# Patient Record
Sex: Male | Born: 1950 | Race: Black or African American | Hispanic: No | Marital: Married | Smoking: Never smoker
Health system: Southern US, Community
[De-identification: ages and names within clinical notes are randomized; demographics above are authoritative.]

## PROBLEM LIST (undated history)

## (undated) DIAGNOSIS — H409 Unspecified glaucoma: Secondary | ICD-10-CM

## (undated) HISTORY — PX: TENDON REPAIR: SHX5111

## (undated) HISTORY — DX: Unspecified glaucoma: H40.9

---

## 2013-06-16 ENCOUNTER — Encounter: Payer: Managed Care, Other (non HMO) | Attending: Family Medicine | Admitting: Dietician

## 2013-06-16 ENCOUNTER — Encounter: Payer: Self-pay | Admitting: Dietician

## 2013-06-16 VITALS — Ht 69.5 in | Wt 172.9 lb

## 2013-06-16 DIAGNOSIS — R7309 Other abnormal glucose: Secondary | ICD-10-CM | POA: Insufficient documentation

## 2013-06-16 DIAGNOSIS — Z713 Dietary counseling and surveillance: Secondary | ICD-10-CM | POA: Insufficient documentation

## 2013-06-16 NOTE — Progress Notes (Signed)
Appt start time: 1700 end time:  1800.  Assessment:  Patient was seen on 06/13/13 for individual diabetes education. Pt reports having made a number of changes and lost about 5 lbs since his visit with MD where he learned of pre-diabetes. No other medical complications noted outside glaucoma.  Current HbA1c: 6.4  Preferred Learning Style:   No preference indicated   Learning Readiness:   Change in progress  MEDICATIONS: see list.  DIETARY INTAKE: Usual eating pattern includes 3 meals and 0-2 snacks per day. Everyday foods include almonds, fruit, veg, chix.  Avoided foods include most processed foods.    24-hr recall:  B ( AM): half grapefruit with nuts and berries. Juice of spinach, kale, romaine, cucumbers, lemon. Oatmeal with berries is also reasonably common. Sometimes dry cereal like corn flakes with almonds (unsweetened) and almond milk. Green tea with stevia recently in favor of coffee with hazelnut creamer. Fridays will go to [employer] to get bacon, egg, and cheese sandwich on english muffin with hazelnut latte.    Snk ( AM): almonds  L ( PM): mainly salads with vinaigrette dressing and grilled chix. Other options include [employer] roast beef with cabbage and spinach.   Snk ( PM): almonds D ( PM): steamed veg such as carrots, greens, broccoli, 3-5 oz. Piece of meat or fish or chix.  Snk ( PM): none Beverages: water, coffee, green tea, grapefruit juice, vegetable juices, red wine Fri and Sat night about 1-2 glasses. Rarely other EtOH.  Pt states he has recently cut out a number of processed foods since recent pre-diabetic HgA1c reading, such as fast food and microwave breakfast sandwiches. He has also basically eliminated sweets entirely.  Usual physical activity: makes effort to do more walking at work by parking farther away, taking stairs, doing some walking on breaks. 2-3 days per week some calisthenics. Recently rejoined [facility]. Enjoys walking, running, cycling. Optimistic about  increasing exercise at [facility] about 2-3 days per week.   Progress Towards Goal(s):  In progress.   Nutritional Diagnosis:  NB-1.1 Food and nutrition-related knowledge deficit As related to eating pattern for pre-diabetes/ carb control.  As evidenced by pt reports of same, lack of prior formal education.    Intervention:  Nutrition counseling provided.  Discussed diabetes disease process and treatment options.  Discussed physiology of diabetes and role of obesity on insulin resistance.  Encouraged moderate weight reduction to improve glucose levels.  Discussed role of medications and diet in glucose control  Provided education on macronutrients on glucose levels.  Provided education on carb counting, importance of regularly scheduled meals/snacks, and meal planning  Discussed effects of physical activity on glucose levels and long-term glucose control.  Recommended 150 minutes of physical activity/week.  Reviewed patient medications.  Discussed role of medication on blood glucose and possible side effects  Discussed blood glucose monitoring and interpretation.  Discussed recommended target ranges and individual ranges.    Described short-term complications: hyper- and hypo-glycemia.  Discussed causes,symptoms, and treatment options.  Discussed prevention, detection, and treatment of long-term complications.  Discussed the role of prolonged elevated glucose levels on body systems.  Discussed role of stress on blood glucose levels and discussed strategies to manage psychosocial issues.  Discussed recommendations for long-term diabetes self-care.  Established checklist for medical, dental, and emotional self-care.  Teaching Method Utilized:  Visual Auditory  Handouts given during visit include:  Living Well with Diabetes  Best CHO, Pro, and Fat rich foods  Home Workout  Barriers to learning/adherence  to lifestyle change: minimal.  Diabetes self-care support plan:   [facility] support  group  Pt's wife is here today as well to learn more about appropriate alterations to diet.  Pt and wife have already made numerous changes for the positive. RD interventions included kcal control to 1800-2100 per day to continue gradual weight loss, increase total exercise to one hour per day.  Demonstrated degree of understanding via:  Teach Back   Monitoring/Evaluation:  Dietary intake, exercise, portion control, and body weight in 2 month(s).

## 2013-07-18 ENCOUNTER — Ambulatory Visit: Payer: Self-pay | Admitting: *Deleted

## 2013-08-18 ENCOUNTER — Encounter: Payer: Managed Care, Other (non HMO) | Attending: Family Medicine | Admitting: Dietician

## 2013-08-18 ENCOUNTER — Encounter: Payer: Self-pay | Admitting: Dietician

## 2013-08-18 VITALS — Ht 69.5 in | Wt 168.1 lb

## 2013-08-18 DIAGNOSIS — Z713 Dietary counseling and surveillance: Secondary | ICD-10-CM | POA: Insufficient documentation

## 2013-08-18 DIAGNOSIS — R7309 Other abnormal glucose: Secondary | ICD-10-CM | POA: Insufficient documentation

## 2013-08-18 NOTE — Progress Notes (Signed)
Assessment: Pt reports for F/U on pre-DM with previous weight ~173 lbs and HgA1c 6.4. Now, his weight is 168 lbs with HgA1c 6.1. He has taken to eating much more nonstarchy vegetable and lean protein, although he is concerned with possibly too low protein intake with breakfast (often only cereal and almond milk). He has some recent GI distress with loose stool and a Hx of IBS. His exercise is now roughly 2 hours each weekend with some walking during the week, and more daily lifestyle activity, like using stairs and parking the care farther from office.     Intervention:  RD advised pt that loose stool is most likely related to shift toward much higher fiber diet. He can balance this with higher dairy intake, mainly Kefir, Greek yogurt, and lactose-free milk. For breakfast, RD highlighted easy options, and suggested a protein shake as a meal replacement for very fast high protein breakfast. Approved supplement list provided. RD advised pt to increase walking or other exercise frequency and duration to 30 minutes each day during work week to maximize results.   Demonstrated degree of understanding via:  Teach Back   Monitoring/Evaluation:  Dietary intake, exercise, GI symptoms, HgA1c, and body weight in 6 month(s).

## 2014-02-17 ENCOUNTER — Ambulatory Visit: Payer: Managed Care, Other (non HMO) | Admitting: Dietician

## 2014-02-21 ENCOUNTER — Ambulatory Visit: Payer: Managed Care, Other (non HMO) | Admitting: *Deleted

## 2014-02-21 ENCOUNTER — Encounter: Payer: Managed Care, Other (non HMO) | Attending: Family Medicine | Admitting: *Deleted

## 2014-02-21 DIAGNOSIS — Z713 Dietary counseling and surveillance: Secondary | ICD-10-CM | POA: Diagnosis not present

## 2014-02-21 DIAGNOSIS — R7309 Other abnormal glucose: Secondary | ICD-10-CM | POA: Diagnosis not present

## 2014-02-21 DIAGNOSIS — R7303 Prediabetes: Secondary | ICD-10-CM

## 2014-02-21 NOTE — Progress Notes (Signed)
Appointment start time 1415  Appointment end time 1500  Assessment: Gregory Ferguson is here for follow up nutrition pertaining to diabetes education. He has worked with [employer], RD in the past. Reports most recent A1c is 6.1%, which is maintained since the spring.  He reports continued improved dietary habits: limiting sugars and choosing low GI foods.  He hasn't exercised as much. He had been maintaining his weight around 160 lb.  B: flatbread sandwich with egg whites and bacon.  6 oz OJ Trop 50; sometimes cheerios or another low sugar cereal with Fairlife milk L: salad from [employer] with lite [place] dressing, or grilled chicken sandwich; or [employer] with chicken or fish with vegetables S: almonds or crackers and peanut butter  D: salmon or another type of fish with vegetable (steamed). Seldom has red meat and typically foods are baked or broiled S: never dessert.  Sometimes has protein bar or apple  Misses starchy foods and gravy   Intervention:  Corrected beliefs about carbohydrates and encouraged Jamond to add carbs back into his life in moderation.  Discussed carb counting and reading food labels.  Recommended daily physical activity and suggested walking with wife after dinner.  Discussed role of BGM and agreed it was a good idea to give Joshue more knowledge.  Meal plan: 3-4 carb choices/meal and 1-2 carb choices/snack  Handouts given: Meal plan card Snack sheet   Demonstrated degree of understanding via:  Teach Back   Monitoring/Evaluation:  Dietary intake, exercise, HgA1c, and body weight in 3 month(s).

## 2014-05-25 ENCOUNTER — Ambulatory Visit: Payer: Managed Care, Other (non HMO) | Admitting: *Deleted

## 2014-05-30 ENCOUNTER — Encounter: Payer: Managed Care, Other (non HMO) | Attending: Family Medicine | Admitting: *Deleted

## 2014-05-30 DIAGNOSIS — Z713 Dietary counseling and surveillance: Secondary | ICD-10-CM | POA: Insufficient documentation

## 2014-05-30 DIAGNOSIS — R7309 Other abnormal glucose: Secondary | ICD-10-CM | POA: Diagnosis not present

## 2014-05-30 NOTE — Patient Instructions (Signed)
Fat slows down the digestion and absorption of carbohydrate.   Foods that are high in both fat and carbs like pizza, [place], [place] foods can cause your glucose to stay high longer.   Aim for exericse on lunch break (30 minutes) then when it's light outside again, exercise in the evenings

## 2014-05-30 NOTE — Progress Notes (Signed)
Appointment start time: 0930  Appointment end time 1000  Assessment: Gregory Ferguson is here for follow up nutrition pertaining to diabetes education. Reports most recent A1c is 6.2%, which is up slightly from 6.1%.  He has incorporated more carbohydrates in his diet, which is good because he was too restrictive before.  However, he is not as physically active as he was before.  He tries to park as far away as possible and take the staris whenever possible, but he does not engage in purposeful exercise  Dietary recall: B: french toast sticks (4) with 2 sausage patties, and 6 oz OJ L: Asian salad S: not usually.  Might have protein bar or Nabs  D: 2 slices multigrain bread with pork BBQ and cole slaw and glass water S: peanuts   Intervention: Nutrition counseling provided.  Answered patient questions about role of fat on glucose levels and recommendations for fat.  Reiterated importance of regular physical activity on glucose levels, as well as preventing dementia, which is a concern of his.  Recommended walking on lunch break until it's daylight savings time again and he can go in the evenings  Meal plan: 3-4 carb choices/meal and 1-2 carb choices/snack   Demonstrated degree of understanding via:  Teach Back   Monitoring/Evaluation:  Dietary intake, exercise, HgA1c, and body weight in 3 month(s).

## 2015-01-10 ENCOUNTER — Other Ambulatory Visit: Payer: Self-pay | Admitting: Family Medicine

## 2015-01-10 DIAGNOSIS — M5416 Radiculopathy, lumbar region: Secondary | ICD-10-CM

## 2015-01-20 ENCOUNTER — Ambulatory Visit
Admission: RE | Admit: 2015-01-20 | Discharge: 2015-01-20 | Disposition: A | Discharge status: Home / Self Care | Payer: Managed Care, Other (non HMO) | Source: Ambulatory Visit | Attending: Family Medicine | Admitting: Family Medicine

## 2015-01-20 DIAGNOSIS — M5416 Radiculopathy, lumbar region: Secondary | ICD-10-CM

## 2015-02-09 ENCOUNTER — Encounter: Payer: Self-pay | Admitting: Physical Therapy

## 2015-02-09 ENCOUNTER — Ambulatory Visit: Payer: Managed Care, Other (non HMO) | Attending: Family Medicine | Admitting: Physical Therapy

## 2015-02-09 DIAGNOSIS — R29898 Other symptoms and signs involving the musculoskeletal system: Secondary | ICD-10-CM | POA: Diagnosis present

## 2015-02-09 DIAGNOSIS — M24651 Ankylosis, right hip: Secondary | ICD-10-CM | POA: Diagnosis present

## 2015-02-09 NOTE — Patient Instructions (Signed)
ABDUCTION: Side-Lying (Active)   Lie on left side, top leg straight. Raise top leg as far as possible. Use ___ lbs. Complete ___ sets of ___ repetitions. Perform ___ sessions per day.  http://gtsc.exer.us/94   Copyright  VHI. All rights reserved.  Abduction: Clam (Eccentric) - Side-Lying   Lie on side with knees bent. Lift top knee, keeping feet together. Keep trunk steady. Slowly lower for 3-5 seconds. ___ reps per set, ___ sets per day, ___ days per week. Add ___ lbs when you achieve ___ repetitions.  Copyright  VHI. All rights reserved.  Hamstring Stretch   With other leg bent, foot flat, grasp right leg and slowly try to straighten knee. Hold ____ seconds. Repeat ____ times. Do ____ sessions per day.  http://gt2.exer.us/279   Copyright  VHI. All rights reserved.  Extensors / Rotators, Supine   Lie supine, one leg straight, other leg bent, knee held by opposite hand. Gently pull knee toward opposite shoulder. Feel stretch in buttocks and outside of hip. Hold ___ seconds. Repeat ___ times per session. Do ___ sessions per day.  Copyright  VHI. All rights reserved.

## 2015-02-09 NOTE — Therapy (Signed)
[facility] [address], [place], [place], [address] Phone: [phone]   Fax:  [phone]  Physical Therapy Evaluation  Patient Details  Name: Gregory Ferguson MRN: [id number] Date of Birth: 29-Oct-1950 Referring Provider:  Lujean Amel, MD  Encounter Date: 02/09/2015      PT End of Session - 02/09/15 0832    Visit Number 1   Date for PT Re-Evaluation 03/09/15   PT Start Time 0759   PT Stop Time 0833   PT Time Calculation (min) 34 min   Activity Tolerance Patient tolerated treatment well   Behavior During Therapy [facility] for tasks assessed/performed      Past Medical History  Diagnosis Date  . Glaucoma     Past Surgical History  Procedure Laterality Date  . Tendon repair Right     also left    There were no vitals filed for this visit.  Visit Diagnosis:  Weakness of right hip - Plan: PT plan of care cert/re-cert  Decreased range of motion of hip, right - Plan: PT plan of care cert/re-cert      Subjective Assessment - 02/09/15 0805    Pertinent History history of 2 x achilles tendon rupture and repair 20-30 years ago            [facility] PT Assessment - 02/09/15 0001    Assessment   Medical Diagnosis Rt hip pain, low back pain   Onset Date/Surgical Date 08/09/14   Next MD Visit none scheduled   Precautions   Precautions None   Restrictions   Weight Bearing Restrictions No   Balance Screen   Has the patient fallen in the past 6 months No   [place] residence   Prior Function   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   Observation/Other Assessments   Focus on Therapeutic Outcomes (FOTO)  25%   Sensation   Light Touch Appears Intact   ROM / Strength   AROM / PROM / Strength AROM;Strength   AROM   Overall AROM  --  lumbar ROM WFL   Right/Left Hip Right   Right Hip Extension --  WFL   Right Hip Flexion --  90, limited by HS  length   Right Hip External Rotation  --  WFL   Right Hip Internal Rotation  --  [facility]   Strength   Strength Assessment Site Hip   Right/Left Hip Right   Right Hip Flexion 4/5   Right Hip Extension 4/5   Right Hip ABduction 4-/5   Flexibility   Soft Tissue Assessment /Muscle Length --  decreased HS length R>L   Palpation   SI assessment  --  no tenderness   Palpation comment --  TTP Rt piriformis   Special Tests    Special Tests Lumbar   Lumbar Tests FABER test;Straight Leg Raise   FABER test   findings Negative   Straight Leg Raise   Findings Negative                           PT Education - 02/09/15 0828    Education provided Yes   Education Details HEP   Person(s) Educated Patient   Methods Explanation;Demonstration;Handout   Comprehension Returned demonstration;Verbalized understanding          PT Short Term Goals - 02/09/15 0835    PT SHORT TERM GOAL #1   Title  Pt will be I with basic HEP   Time 2   Period Weeks   PT SHORT TERM GOAL #2   Title Pt will improve Rt hip strength to 4+/5 to tolerate activity with decreased symptoms   Time 2   Period Weeks           PT Long Term Goals - 02/09/15 0836    PT LONG TERM GOAL #1   Title Pt will be I wiht advanced HEP   Time 4   Period Weeks   PT LONG TERM GOAL #2   Title Pt will improve FOTO to 20% to demo improved funcitonal mobility   Time 4   Period Weeks   PT LONG TERM GOAL #3   Title Pt will perform walking 30 minutes wihtout increase in symptoms   Time 4   Period Weeks               Plan - 02/09/15 1749    Clinical Impression Statement Pt presents with pain in Rt hip radiating to Rt foot, intermittent pain.  Pt with decreased mm lenght in hips and hamstrings, mm weakness in hip abductors.  Pt will benefit from skilled PT to address deficits and increase functional mobility without symptoms.   Pt will benefit from skilled therapeutic intervention in order to improve on  the following deficits Decreased strength;Difficulty walking;Pain;Impaired flexibility;Decreased activity tolerance   Rehab Potential Good   PT Frequency 1x / week   PT Duration 4 weeks   PT Treatment/Interventions Cryotherapy;Electrical Stimulation;Moist Heat;Patient/family education;Neuromuscular re-education;Therapeutic exercise;Therapeutic activities;Functional mobility training;Balance training;Manual techniques;Passive range of motion   PT Next Visit Plan assess HEP, progress strength, stretching, manual as indicated   PT Home Exercise Plan Rt hip stretching and strengthening   Consulted and Agree with Plan of Care Patient         Problem List Patient Active Problem List   Diagnosis Date Noted  . Other abnormal glucose 06/16/2013    Isabelle Course, PT, DPT  02/09/2015, 8:42 AM  [facility] [address] [place], [place], [address] Phone: [phone]   Fax:  [phone]

## 2015-02-15 ENCOUNTER — Ambulatory Visit: Payer: Managed Care, Other (non HMO) | Admitting: Physical Therapy

## 2015-02-15 ENCOUNTER — Encounter: Payer: Self-pay | Admitting: Physical Therapy

## 2015-02-15 DIAGNOSIS — R29898 Other symptoms and signs involving the musculoskeletal system: Secondary | ICD-10-CM

## 2015-02-15 DIAGNOSIS — M24651 Ankylosis, right hip: Secondary | ICD-10-CM

## 2015-02-15 NOTE — Patient Instructions (Signed)
Piriformis (Supine)  Cross legs, right on top. Gently pull other knee toward chest until stretch is felt in buttock/hip of top leg. Hold ____ seconds. Repeat  3 times per set. Do  3  sets per session. Do  1 -2  sessions per day.  Hip Stretch  Put right ankle over left knee. Let right knee fall downward, but keep ankle in place. Feel the stretch in hip. May push down gently with hand to feel stretch. Hold ____ seconds while counting out loud. Repeat with other leg. Repeat  3  times. Do  3  sessions per day.  Stretching: Piriformis   Cross right leg over other thigh and place elbow over outside of knee. Gently stretch buttock muscles by pushing bent knee across body. Hold  20 seconds. Repeat  3  times per set. Do  2-3  sets per session. Do 3 sessions per day.  Stretching: Piriformis (Supine)  Pull right knee toward opposite shoulder. Hold _20___ seconds. Relax. Repeat _3__ times per set. Do __2-3__ sets per session. Do ____ sessions per day.  Piriformis Stretch, Kneeling Modified [place]  From hands and knees, slide one leg backward, turn bent other leg out slightly to side. Rest weight on outside of bent leg. If extended hip is elevated place a blanket or towel underneath to relax hip. With back straight, lay trunk forward over bent leg. Hold  20  seconds.  Repeat 3  times per session. Do  2-3  sessions per day.  Copyright  VHI. All rights reserved.

## 2015-02-15 NOTE — Therapy (Addendum)
[facility] [address] [place], [place], [address] Phone: [phone]   Fax:  [phone]  Physical Therapy Treatment  Patient Details  Name: Gregory Ferguson MRN: [id number] Date of Birth: 1951-04-28 Referring Provider:  Lujean Amel, MD  Encounter Date: 02/15/2015      PT End of Session - 02/15/15 1206    Visit Number 2   Date for PT Re-Evaluation 03/09/15   PT Start Time 1146   PT Stop Time 1229   PT Time Calculation (min) 43 min   Activity Tolerance Patient tolerated treatment well   Behavior During Therapy [facility] for tasks assessed/performed      Past Medical History  Diagnosis Date  . Glaucoma     Past Surgical History  Procedure Laterality Date  . Tendon repair Right     also left    There were no vitals filed for this visit.  Visit Diagnosis:  Decreased range of motion of hip, right  Weakness of right hip      Subjective Assessment - 02/15/15 1154    Subjective Pt reports compliance with initial HEP. Rt hip is aching and feeling of tired feeling - intermittend.     Pertinent History history of 2 x achilles tendon rupture and repair 20-30 years ago   Patient Stated Goals decrease aching/pain   Currently in Pain? Yes   Pain Score 4   4/10   Pain Location Hip   Pain Orientation Right   Pain Descriptors / Indicators Aching   Pain Type Chronic pain   Pain Onset More than a month ago   Pain Frequency Intermittent                         [facility] Adult PT Treatment/Exercise - 02/15/15 0001    Exercises   Exercises Knee/Hip;Lumbar   Lumbar Exercises: Stretches   Single Knee to Chest Stretch 3 reps;20 seconds  each leg   Piriformis Stretch 3 reps;20 seconds  in supine using towel, halfsitting and sitting at end of tab   Lumbar Exercises: Supine   Bridge 10 reps;5 seconds  needs vc's to keep pelvic leveled   Knee/Hip Exercises: Stretches   Active Hamstring Stretch 3 reps;20  seconds  on stairs   Knee/Hip Exercises: Aerobic   Stationary Bike L 1 63mn    Knee/Hip Exercises: Prone   Hip Extension Strengthening;1 set;10 reps  with qluteal squezzes, challenging due to weakness                PT Education - 02/15/15 1313    Education provided Yes   Education Details Figure 4 stretch in supint halfsitting, sitting and prone   Person(s) Educated Patient   Methods Explanation;Demonstration;Handout   Comprehension Returned demonstration          PT Short Term Goals - 02/15/15 1317    PT SHORT TERM GOAL #1   Title Pt will be I with basic HEP   Time 2   Period Weeks   Status On-going   PT SHORT TERM GOAL #2   Title Pt will improve Rt hip strength to 4+/5 to tolerate activity with decreased symptoms   Time 2   Period Weeks   Status On-going           PT Long Term Goals - 02/15/15 1317    PT LONG TERM GOAL #1   Title Pt will be I wiht advanced HEP   Time 4  Period Weeks   Status On-going   PT LONG TERM GOAL #2   Title Pt will improve FOTO to 20% to demo improved funcitonal mobility   Time 4   Period Weeks   Status On-going   PT LONG TERM GOAL #3   Title Pt will perform walking 30 minutes wihtout increase in symptoms   Time 4   Period Weeks   Status On-going               Plan - 02/15/15 1313    Clinical Impression Statement Pt with discomfort in Rt hip radiating to Rt foot, intermittend, Pt with tight hamstrings and weak hip abductors. Pt will benefit from skilled PT to address deficits and manage discomfort.   Pt will benefit from skilled therapeutic intervention in order to improve on the following deficits Decreased strength;Difficulty walking;Pain;Impaired flexibility;Decreased activity tolerance   Rehab Potential Good   PT Frequency 1x / week   PT Duration 4 weeks   PT Treatment/Interventions Cryotherapy;Electrical Stimulation;Moist Heat;Patient/family education;Neuromuscular re-education;Therapeutic  exercise;Therapeutic activities;Functional mobility training;Balance training;Manual techniques;Passive range of motion   PT Next Visit Plan Review figure 4 stretch in all variations -practice prone piriformis stretch (eye of the needle)   PT Home Exercise Plan Rt hip stretching and strengthening   Consulted and Agree with Plan of Care Patient        Problem List Patient Active Problem List   Diagnosis Date Noted  . Other abnormal glucose 06/16/2013    NAUMANN-HOUEGNIFIO,ELKE 02/15/2015, 1:19 PM PHYSICAL THERAPY DISCHARGE SUMMARY  Visits from Start of Care: 2  Current functional level related to goals / functional outcomes: Pt attended 2 PT visits and requested D/C due to work schedule.     Remaining deficits: See above for most current status.     Education / Equipment: HEP Plan: Patient agrees to discharge.  Patient goals were partially met. Patient is being discharged due to the patient's request.  ?????   Sigurd Sos, PT 03/08/2015 4:14 PM  [facility] [address] [place], [place], [address] Phone: [phone]   Fax:  [phone]

## 2015-02-28 ENCOUNTER — Ambulatory Visit: Payer: Managed Care, Other (non HMO) | Attending: Family Medicine | Admitting: Physical Therapy

## 2015-03-08 ENCOUNTER — Encounter: Payer: Managed Care, Other (non HMO) | Admitting: Physical Therapy

## 2016-10-31 DIAGNOSIS — Z23 Encounter for immunization: Secondary | ICD-10-CM | POA: Diagnosis not present

## 2016-10-31 DIAGNOSIS — R7303 Prediabetes: Secondary | ICD-10-CM | POA: Diagnosis not present

## 2016-10-31 DIAGNOSIS — Z13220 Encounter for screening for lipoid disorders: Secondary | ICD-10-CM | POA: Diagnosis not present

## 2016-10-31 DIAGNOSIS — Z0000 Encounter for general adult medical examination without abnormal findings: Secondary | ICD-10-CM | POA: Diagnosis not present

## 2016-12-19 DIAGNOSIS — Z1211 Encounter for screening for malignant neoplasm of colon: Secondary | ICD-10-CM | POA: Diagnosis not present

## 2017-02-06 DIAGNOSIS — H401123 Primary open-angle glaucoma, left eye, severe stage: Secondary | ICD-10-CM | POA: Diagnosis not present

## 2017-03-05 DIAGNOSIS — Z23 Encounter for immunization: Secondary | ICD-10-CM | POA: Diagnosis not present

## 2017-05-05 DIAGNOSIS — R7303 Prediabetes: Secondary | ICD-10-CM | POA: Diagnosis not present

## 2017-06-03 DIAGNOSIS — H547 Unspecified visual loss: Secondary | ICD-10-CM | POA: Diagnosis not present

## 2017-06-03 DIAGNOSIS — H524 Presbyopia: Secondary | ICD-10-CM | POA: Diagnosis not present

## 2017-06-03 DIAGNOSIS — H52223 Regular astigmatism, bilateral: Secondary | ICD-10-CM | POA: Diagnosis not present

## 2017-06-03 DIAGNOSIS — H401122 Primary open-angle glaucoma, left eye, moderate stage: Secondary | ICD-10-CM | POA: Diagnosis not present

## 2017-06-03 DIAGNOSIS — H5203 Hypermetropia, bilateral: Secondary | ICD-10-CM | POA: Diagnosis not present

## 2017-06-15 DIAGNOSIS — H903 Sensorineural hearing loss, bilateral: Secondary | ICD-10-CM | POA: Diagnosis not present

## 2017-06-15 DIAGNOSIS — H9113 Presbycusis, bilateral: Secondary | ICD-10-CM | POA: Diagnosis not present

## 2017-06-29 DIAGNOSIS — H401111 Primary open-angle glaucoma, right eye, mild stage: Secondary | ICD-10-CM | POA: Diagnosis not present

## 2017-06-29 DIAGNOSIS — H401123 Primary open-angle glaucoma, left eye, severe stage: Secondary | ICD-10-CM | POA: Diagnosis not present

## 2017-06-29 DIAGNOSIS — H35372 Puckering of macula, left eye: Secondary | ICD-10-CM | POA: Diagnosis not present

## 2017-07-09 DIAGNOSIS — H401121 Primary open-angle glaucoma, left eye, mild stage: Secondary | ICD-10-CM | POA: Diagnosis not present

## 2017-07-09 DIAGNOSIS — H5051 Esophoria: Secondary | ICD-10-CM | POA: Diagnosis not present

## 2017-07-09 DIAGNOSIS — H52223 Regular astigmatism, bilateral: Secondary | ICD-10-CM | POA: Diagnosis not present

## 2017-07-09 DIAGNOSIS — H5203 Hypermetropia, bilateral: Secondary | ICD-10-CM | POA: Diagnosis not present

## 2017-09-09 DIAGNOSIS — H401123 Primary open-angle glaucoma, left eye, severe stage: Secondary | ICD-10-CM | POA: Diagnosis not present

## 2017-10-07 DIAGNOSIS — H401123 Primary open-angle glaucoma, left eye, severe stage: Secondary | ICD-10-CM | POA: Diagnosis not present

## 2017-10-19 DIAGNOSIS — H401123 Primary open-angle glaucoma, left eye, severe stage: Secondary | ICD-10-CM | POA: Diagnosis not present

## 2017-11-11 DIAGNOSIS — R7303 Prediabetes: Secondary | ICD-10-CM | POA: Diagnosis not present

## 2017-11-11 DIAGNOSIS — Z0001 Encounter for general adult medical examination with abnormal findings: Secondary | ICD-10-CM | POA: Diagnosis not present

## 2017-11-11 DIAGNOSIS — J069 Acute upper respiratory infection, unspecified: Secondary | ICD-10-CM | POA: Diagnosis not present

## 2017-11-11 DIAGNOSIS — Z79899 Other long term (current) drug therapy: Secondary | ICD-10-CM | POA: Diagnosis not present

## 2017-11-11 DIAGNOSIS — Z125 Encounter for screening for malignant neoplasm of prostate: Secondary | ICD-10-CM | POA: Diagnosis not present

## 2017-11-11 DIAGNOSIS — Z23 Encounter for immunization: Secondary | ICD-10-CM | POA: Diagnosis not present

## 2017-11-12 DIAGNOSIS — Z0001 Encounter for general adult medical examination with abnormal findings: Secondary | ICD-10-CM | POA: Diagnosis not present

## 2017-12-07 DIAGNOSIS — H52203 Unspecified astigmatism, bilateral: Secondary | ICD-10-CM | POA: Diagnosis not present

## 2017-12-07 DIAGNOSIS — H524 Presbyopia: Secondary | ICD-10-CM | POA: Diagnosis not present

## 2017-12-07 DIAGNOSIS — H5203 Hypermetropia, bilateral: Secondary | ICD-10-CM | POA: Diagnosis not present

## 2018-02-01 DIAGNOSIS — H401123 Primary open-angle glaucoma, left eye, severe stage: Secondary | ICD-10-CM | POA: Diagnosis not present

## 2018-03-12 DIAGNOSIS — Z23 Encounter for immunization: Secondary | ICD-10-CM | POA: Diagnosis not present

## 2018-03-12 DIAGNOSIS — E7800 Pure hypercholesterolemia, unspecified: Secondary | ICD-10-CM | POA: Diagnosis not present

## 2018-03-19 DIAGNOSIS — M67912 Unspecified disorder of synovium and tendon, left shoulder: Secondary | ICD-10-CM | POA: Diagnosis not present

## 2018-04-29 DIAGNOSIS — R7303 Prediabetes: Secondary | ICD-10-CM | POA: Diagnosis not present

## 2018-05-14 DIAGNOSIS — R7303 Prediabetes: Secondary | ICD-10-CM | POA: Diagnosis not present

## 2018-07-05 DIAGNOSIS — H401123 Primary open-angle glaucoma, left eye, severe stage: Secondary | ICD-10-CM | POA: Diagnosis not present

## 2018-07-19 DIAGNOSIS — R1032 Left lower quadrant pain: Secondary | ICD-10-CM | POA: Diagnosis not present

## 2018-07-19 DIAGNOSIS — M545 Low back pain: Secondary | ICD-10-CM | POA: Diagnosis not present

## 2018-07-19 DIAGNOSIS — R1031 Right lower quadrant pain: Secondary | ICD-10-CM | POA: Diagnosis not present

## 2018-08-04 DIAGNOSIS — M545 Low back pain: Secondary | ICD-10-CM | POA: Diagnosis not present

## 2018-08-04 DIAGNOSIS — R1032 Left lower quadrant pain: Secondary | ICD-10-CM | POA: Diagnosis not present

## 2018-08-23 DIAGNOSIS — R05 Cough: Secondary | ICD-10-CM | POA: Diagnosis not present

## 2018-08-23 DIAGNOSIS — R490 Dysphonia: Secondary | ICD-10-CM | POA: Diagnosis not present

## 2018-10-15 ENCOUNTER — Ambulatory Visit
Admission: RE | Admit: 2018-10-15 | Discharge: 2018-10-15 | Disposition: A | Discharge status: Home / Self Care | Payer: PPO | Source: Ambulatory Visit | Attending: Family Medicine | Admitting: Family Medicine

## 2018-10-15 ENCOUNTER — Other Ambulatory Visit: Payer: Self-pay | Admitting: Family Medicine

## 2018-10-15 ENCOUNTER — Other Ambulatory Visit: Payer: Self-pay

## 2018-10-15 DIAGNOSIS — R059 Cough, unspecified: Secondary | ICD-10-CM

## 2018-10-15 DIAGNOSIS — R634 Abnormal weight loss: Secondary | ICD-10-CM | POA: Diagnosis not present

## 2018-10-15 DIAGNOSIS — R05 Cough: Secondary | ICD-10-CM | POA: Diagnosis not present

## 2018-11-12 DIAGNOSIS — H401123 Primary open-angle glaucoma, left eye, severe stage: Secondary | ICD-10-CM | POA: Diagnosis not present

## 2018-11-26 DIAGNOSIS — Z79899 Other long term (current) drug therapy: Secondary | ICD-10-CM | POA: Diagnosis not present

## 2018-11-26 DIAGNOSIS — R7303 Prediabetes: Secondary | ICD-10-CM | POA: Diagnosis not present

## 2018-11-26 DIAGNOSIS — Z0001 Encounter for general adult medical examination with abnormal findings: Secondary | ICD-10-CM | POA: Diagnosis not present

## 2018-11-26 DIAGNOSIS — J45909 Unspecified asthma, uncomplicated: Secondary | ICD-10-CM | POA: Diagnosis not present

## 2018-11-26 DIAGNOSIS — Z23 Encounter for immunization: Secondary | ICD-10-CM | POA: Diagnosis not present

## 2018-11-26 DIAGNOSIS — Z1211 Encounter for screening for malignant neoplasm of colon: Secondary | ICD-10-CM | POA: Diagnosis not present

## 2018-11-26 DIAGNOSIS — Z125 Encounter for screening for malignant neoplasm of prostate: Secondary | ICD-10-CM | POA: Diagnosis not present

## 2018-11-26 DIAGNOSIS — E7800 Pure hypercholesterolemia, unspecified: Secondary | ICD-10-CM | POA: Diagnosis not present

## 2018-12-09 DIAGNOSIS — H401123 Primary open-angle glaucoma, left eye, severe stage: Secondary | ICD-10-CM | POA: Diagnosis not present

## 2019-02-09 DIAGNOSIS — R030 Elevated blood-pressure reading, without diagnosis of hypertension: Secondary | ICD-10-CM | POA: Diagnosis not present

## 2019-02-09 DIAGNOSIS — Z1159 Encounter for screening for other viral diseases: Secondary | ICD-10-CM | POA: Diagnosis not present

## 2019-02-11 DIAGNOSIS — R05 Cough: Secondary | ICD-10-CM | POA: Diagnosis not present

## 2019-02-11 DIAGNOSIS — Z23 Encounter for immunization: Secondary | ICD-10-CM | POA: Diagnosis not present

## 2019-02-11 DIAGNOSIS — R079 Chest pain, unspecified: Secondary | ICD-10-CM | POA: Diagnosis not present

## 2019-02-18 ENCOUNTER — Other Ambulatory Visit: Payer: Self-pay | Admitting: Family Medicine

## 2019-02-18 DIAGNOSIS — R053 Chronic cough: Secondary | ICD-10-CM

## 2019-02-18 DIAGNOSIS — R05 Cough: Secondary | ICD-10-CM

## 2019-02-21 ENCOUNTER — Other Ambulatory Visit: Payer: Self-pay

## 2019-02-21 ENCOUNTER — Ambulatory Visit
Admission: RE | Admit: 2019-02-21 | Discharge: 2019-02-21 | Disposition: A | Discharge status: Home / Self Care | Payer: PPO | Source: Ambulatory Visit | Attending: Family Medicine | Admitting: Family Medicine

## 2019-02-21 DIAGNOSIS — I700 Atherosclerosis of aorta: Secondary | ICD-10-CM | POA: Diagnosis not present

## 2019-02-21 DIAGNOSIS — R05 Cough: Secondary | ICD-10-CM | POA: Diagnosis not present

## 2019-02-21 DIAGNOSIS — R053 Chronic cough: Secondary | ICD-10-CM

## 2019-02-21 DIAGNOSIS — Z8709 Personal history of other diseases of the respiratory system: Secondary | ICD-10-CM | POA: Diagnosis not present

## 2019-03-09 DIAGNOSIS — R05 Cough: Secondary | ICD-10-CM | POA: Diagnosis not present

## 2019-03-09 DIAGNOSIS — R0789 Other chest pain: Secondary | ICD-10-CM | POA: Diagnosis not present

## 2019-03-09 DIAGNOSIS — I10 Essential (primary) hypertension: Secondary | ICD-10-CM | POA: Diagnosis not present

## 2019-03-21 DIAGNOSIS — Z1159 Encounter for screening for other viral diseases: Secondary | ICD-10-CM | POA: Diagnosis not present

## 2019-03-23 ENCOUNTER — Institutional Professional Consult (permissible substitution): Payer: PPO | Admitting: Pulmonary Disease

## 2019-03-30 ENCOUNTER — Other Ambulatory Visit: Payer: Self-pay

## 2019-03-30 ENCOUNTER — Encounter: Payer: Self-pay | Admitting: Pulmonary Disease

## 2019-03-30 ENCOUNTER — Ambulatory Visit: Payer: PPO | Admitting: Pulmonary Disease

## 2019-03-30 VITALS — BP 128/86 | HR 81 | Temp 97.9°F | Ht 70.0 in | Wt 163.8 lb

## 2019-03-30 DIAGNOSIS — R0789 Other chest pain: Secondary | ICD-10-CM | POA: Diagnosis not present

## 2019-03-30 DIAGNOSIS — R059 Cough, unspecified: Secondary | ICD-10-CM

## 2019-03-30 DIAGNOSIS — R0602 Shortness of breath: Secondary | ICD-10-CM

## 2019-03-30 DIAGNOSIS — R05 Cough: Secondary | ICD-10-CM | POA: Diagnosis not present

## 2019-03-30 NOTE — Progress Notes (Signed)
Synopsis: Referred in November 2020 for cough by Lujean Amel, MD  Subjective:   PATIENT ID: Gregory Ferguson GENDER: male DOB: 09-21-1950, MRN: [id number]  Chief Complaint  Patient presents with  . Consult    Consult for Chest discomfort. Reports his breathing feels a bit restrictive. Dry cough. CT 01/2019.    68 yo M, c/o resp symptoms starting in march 2020. He PMH of asthma as an early teenager. He felt like these symptoms were similar to symptoms he experienced then. He feels like yearly in the spring time he normally has some mild chest congestions. He works in the year a lot and feels like allergies does bother him. He routinely wears a mask. He started zrytec in the spring. He was seen in spring time with a cxr and it was clear. He continue to have problems. He was start on budesonide inhaler and he did not see much change. He was given albuterol inhaler. He was having the issues. And there was no difference in his symptoms with albuterol.  He has had several tests to include chest imaging, EKG as well as a CT scan of the chest in October 2020.  There was no evidence of parenchymal disease on CT imaging to explain cough or shortness of breath.   Past Medical History:  Diagnosis Date  . Glaucoma      Family History  Problem Relation Age of Onset  . Hypertension Mother   . Cancer Father   . Hypertension Father   . Diabetes Maternal Uncle   . Diabetes Paternal Uncle      Past Surgical History:  Procedure Laterality Date  . TENDON REPAIR Right    also left    Social History   Socioeconomic History  . Marital status: Married    Spouse name: Not on file  . Number of children: Not on file  . Years of education: Not on file  . Highest education level: Not on file  Occupational History  . Not on file  Social Needs  . Financial resource strain: Not on file  . Food insecurity    Worry: Not on file    Inability: Not on file  . Transportation needs    Medical: Not on file     Non-medical: Not on file  Tobacco Use  . Smoking status: Never Smoker  . Smokeless tobacco: Never Used  Substance and Sexual Activity  . Alcohol use: Not on file  . Drug use: Not on file  . Sexual activity: Not on file  Lifestyle  . Physical activity    Days per week: Not on file    Minutes per session: Not on file  . Stress: Not on file  Relationships  . Social Herbalist on phone: Not on file    Gets together: Not on file    Attends religious service: Not on file    Active member of club or organization: Not on file    Attends meetings of clubs or organizations: Not on file    Relationship status: Not on file  . Intimate partner violence    Fear of current or ex partner: Not on file    Emotionally abused: Not on file    Physically abused: Not on file    Forced sexual activity: Not on file  Other Topics Concern  . Not on file  Social History Narrative  . Not on file     No Known Allergies   Outpatient Medications  Prior to Visit  Medication Sig Dispense Refill  . amLODipine (NORVASC) 2.5 MG tablet Take 2.5 mg by mouth daily.    . brimonidine (ALPHAGAN) 0.2 % ophthalmic solution Place 1 drop into the left eye 2 times daily.    . budesonide-formoterol (SYMBICORT) 80-4.5 MCG/ACT inhaler     . cetirizine (ZYRTEC) 10 MG tablet Take 10 mg by mouth daily.    . dorzolamide-timolol (COSOPT) 22.3-6.8 MG/ML ophthalmic solution Place 1 drop into the left eye every 12 hours.    . lactobacillus acidophilus (BACID) TABS tablet Take 1 tablet by mouth daily.    Marland Kitchen omeprazole (PRILOSEC) 40 MG capsule Take 40 mg by mouth every morning.    Marland Kitchen PROAIR HFA 108 (90 Base) MCG/ACT inhaler TAKE 2 PUFFS AS NEEDED EVERY 4 HRS IF NEEDED FOR WHEEZING INHALATION 30 DAYS     No facility-administered medications prior to visit.     Review of Systems  Constitutional: Negative for chills, fever, malaise/fatigue and weight loss.  HENT: Negative for hearing loss, sore throat and tinnitus.    Eyes: Negative for blurred vision and double vision.  Respiratory: Positive for cough and shortness of breath. Negative for hemoptysis, sputum production, wheezing and stridor.   Cardiovascular: Negative for chest pain, palpitations, orthopnea, leg swelling and PND.  Gastrointestinal: Negative for abdominal pain, constipation, diarrhea, heartburn, nausea and vomiting.  Genitourinary: Negative for dysuria, hematuria and urgency.  Musculoskeletal: Negative for joint pain and myalgias.  Skin: Negative for itching and rash.  Neurological: Negative for dizziness, tingling, weakness and headaches.  Endo/Heme/Allergies: Negative for environmental allergies. Does not bruise/bleed easily.  Psychiatric/Behavioral: Negative for depression. The patient is not nervous/anxious and does not have insomnia.   All other systems reviewed and are negative.    Objective:  Physical Exam Vitals signs reviewed.  Constitutional:      General: He is not in acute distress.    Appearance: He is well-developed.  HENT:     Head: Normocephalic and atraumatic.  Eyes:     General: No scleral icterus.    Conjunctiva/sclera: Conjunctivae normal.     Pupils: Pupils are equal, round, and reactive to light.  Neck:     Musculoskeletal: Neck supple.     Vascular: No JVD.     Trachea: No tracheal deviation.  Cardiovascular:     Rate and Rhythm: Normal rate and regular rhythm.     Heart sounds: Normal heart sounds. No murmur.  Pulmonary:     Effort: Pulmonary effort is normal. No tachypnea, accessory muscle usage or respiratory distress.     Breath sounds: Normal breath sounds. No stridor. No wheezing, rhonchi or rales.  Abdominal:     General: Bowel sounds are normal. There is no distension.     Palpations: Abdomen is soft.     Tenderness: There is no abdominal tenderness.  Musculoskeletal:        General: No tenderness.  Lymphadenopathy:     Cervical: No cervical adenopathy.  Skin:    General: Skin is warm and  dry.     Capillary Refill: Capillary refill takes less than 2 seconds.     Findings: No rash.  Neurological:     Mental Status: He is alert and oriented to person, place, and time.  Psychiatric:        Behavior: Behavior normal.      Vitals:   03/30/19 1101  BP: 128/86  Pulse: 81  Temp: 97.9 F (36.6 C)  TempSrc: Temporal  SpO2: 99%  Weight:  163 lb 12.8 oz (74.3 kg)  Height: 5\' 10"  (1.778 m)   99% on RA BMI Readings from Last 3 Encounters:  03/30/19 23.50 kg/m  08/18/13 24.47 kg/m  06/16/13 25.17 kg/m   Wt Readings from Last 3 Encounters:  03/30/19 163 lb 12.8 oz (74.3 kg)  08/18/13 168 lb 1.6 oz (76.2 kg)  06/16/13 172 lb 14.4 oz (78.4 kg)    CBC No results found for: WBC, RBC, HGB, HCT, PLT, MCV, MCH, MCHC, RDW, LYMPHSABS, MONOABS, EOSABS, BASOSABS  Chest Imaging:  February 21, 2019: CT chest: No acute process to explain cough per radiology.  I have reviewed the images myself.  Lung parenchyma looks normal. The patient's images have been independently reviewed by me.    Pulmonary Functions Testing Results: No flowsheet data found.  FeNO: None   Pathology: None   Echocardiogram: None   Heart Catheterization: None     Assessment & Plan:     ICD-10-CM   1. Cough  R05 FULL - Pulmonary Function Test (LBPU)  2. SOB (shortness of breath)  R06.02 FULL - Pulmonary Function Test (LBPU)  3. Chest discomfort  R07.89 FULL - Pulmonary Function Test (LBPU)    Discussion:  This is a 68 year old gentleman with recurrent symptoms of chest discomfort with an occasional cough and shortness of breath/restrictive sensation within the chest.  He has had several diagnostics completed over the past several months.  This started back in March 2020.  He does have a history of asthma as a child however his symptoms at this point seeming to be very episodic usually associated in the morning time.  He does not have any symptoms associated with exercise or riding his bike or  working outside.  Plan: I think we should start with the least invasive work-up to include pulmonary function test. His other tests have all been reassuring. If there is abnormalities within the pulmonary function test I think we could should consider additional work-up.  We could include a methacholine challenge test following normal PFTs but we will discuss this with the patient later. If he has any additional symptoms to include things like chest pain we may need to consider cardiac evaluation.  This was discussed with the patient today in the office but he believes that since the symptoms have been relatively stable since then we should just start with pulmonary function test.  And I agree. Patient was instructed to keep [place] informed about changes in any of his symptoms. He should also make sure to control any reflux symptoms that he has. If bronchial type symptoms recur I believe the next best step would to be restart the inhaled steroid that he had tried in the past and use it for at least 4 to 6 weeks daily to see if there is any improvement or change in symptoms.  Patient is also going to get back into his regular exercise and bicycle riding routine as he has not noticed any significant change in his respiratory symptoms with writing.  Patient to return to the office and see me following his pulmonary function test.  Whenever the next available appointment.  Greater than 50% of this patient's 60-minute of visit was been face-to-face discussing above recommendations and treatment plan.  In addition we reviewed the patient's CT imaging today in the office.    Current Outpatient Medications:  .  amLODipine (NORVASC) 2.5 MG tablet, Take 2.5 mg by mouth daily., Disp: , Rfl:  .  brimonidine (ALPHAGAN) 0.2 % ophthalmic  solution, Place 1 drop into the left eye 2 times daily., Disp: , Rfl:  .  budesonide-formoterol (SYMBICORT) 80-4.5 MCG/ACT inhaler, , Disp: , Rfl:  .  cetirizine (ZYRTEC) 10 MG  tablet, Take 10 mg by mouth daily., Disp: , Rfl:  .  dorzolamide-timolol (COSOPT) 22.3-6.8 MG/ML ophthalmic solution, Place 1 drop into the left eye every 12 hours., Disp: , Rfl:  .  lactobacillus acidophilus (BACID) TABS tablet, Take 1 tablet by mouth daily., Disp: , Rfl:  .  omeprazole (PRILOSEC) 40 MG capsule, Take 40 mg by mouth every morning., Disp: , Rfl:  .  PROAIR HFA 108 (90 Base) MCG/ACT inhaler, TAKE 2 PUFFS AS NEEDED EVERY 4 HRS IF NEEDED FOR WHEEZING INHALATION 30 DAYS, Disp: , Rfl:    Garner Nash, DO [facility] 03/30/2019 11:15 AM

## 2019-03-30 NOTE — Patient Instructions (Addendum)
Thank you for visiting Dr. Valeta Harms at [facility]. Today we recommend the following:  Orders Placed This Encounter  Procedures  . FULL - Pulmonary Function Test (LBPU)   Keep [place] informed about symptoms.   Return in about 8 weeks (around 05/25/2019).    Please do your part to reduce the spread of COVID-19.

## 2019-05-06 DIAGNOSIS — R7303 Prediabetes: Secondary | ICD-10-CM | POA: Diagnosis not present

## 2019-05-11 DIAGNOSIS — H401123 Primary open-angle glaucoma, left eye, severe stage: Secondary | ICD-10-CM | POA: Diagnosis not present

## 2019-05-24 ENCOUNTER — Other Ambulatory Visit (HOSPITAL_COMMUNITY): Payer: PPO

## 2019-06-10 ENCOUNTER — Ambulatory Visit: Payer: PPO | Attending: Internal Medicine

## 2019-06-10 DIAGNOSIS — Z23 Encounter for immunization: Secondary | ICD-10-CM

## 2019-06-10 NOTE — Progress Notes (Signed)
   Covid-19 Vaccination Clinic  Name:  ALEC KAROW    MRN: [id number] DOB: 04-Jun-1950  06/10/2019  Mr. Fischetti was observed post Covid-19 immunization for 15 minutes without incidence. He was provided with Vaccine Information Sheet and instruction to access the V-Safe system.   Mr. Dorin was instructed to call 911 with any severe reactions post vaccine: Marland Kitchen Difficulty breathing  . Swelling of your face and throat  . A fast heartbeat  . A bad rash all over your body  . Dizziness and weakness    Immunizations Administered    Name Date Dose VIS Date Route   Pfizer COVID-19 Vaccine 06/10/2019  4:41 PM 0.3 mL 04/29/2019 Intramuscular   Manufacturer: [place]   Lot: [id number]   [place]: [id number]

## 2019-07-01 ENCOUNTER — Ambulatory Visit: Payer: PPO | Attending: Internal Medicine

## 2019-07-01 DIAGNOSIS — Z23 Encounter for immunization: Secondary | ICD-10-CM | POA: Insufficient documentation

## 2019-07-01 NOTE — Progress Notes (Signed)
   Covid-19 Vaccination Clinic  Name:  Gregory Ferguson    MRN: [id number] DOB: 1950-05-31  07/01/2019  Mr. Nevel was observed post Covid-19 immunization for 15 minutes without incidence. He was provided with Vaccine Information Sheet and instruction to access the V-Safe system.   Mr. Hinch was instructed to call 911 with any severe reactions post vaccine: Marland Kitchen Difficulty breathing  . Swelling of your face and throat  . A fast heartbeat  . A bad rash all over your body  . Dizziness and weakness    Immunizations Administered    Name Date Dose VIS Date Route   Pfizer COVID-19 Vaccine 07/01/2019  3:50 PM 0.3 mL 04/29/2019 Intramuscular   Manufacturer: [place]   Lot: [id number]   [place]: [id number]

## 2019-08-03 DIAGNOSIS — L739 Follicular disorder, unspecified: Secondary | ICD-10-CM | POA: Diagnosis not present

## 2019-10-21 DIAGNOSIS — H401123 Primary open-angle glaucoma, left eye, severe stage: Secondary | ICD-10-CM | POA: Diagnosis not present

## 2019-10-27 DIAGNOSIS — M722 Plantar fascial fibromatosis: Secondary | ICD-10-CM | POA: Diagnosis not present

## 2019-10-27 DIAGNOSIS — M545 Low back pain: Secondary | ICD-10-CM | POA: Diagnosis not present

## 2019-11-28 DIAGNOSIS — M7581 Other shoulder lesions, right shoulder: Secondary | ICD-10-CM | POA: Diagnosis not present

## 2019-11-28 DIAGNOSIS — M545 Low back pain: Secondary | ICD-10-CM | POA: Diagnosis not present

## 2019-12-09 DIAGNOSIS — M545 Low back pain: Secondary | ICD-10-CM | POA: Diagnosis not present

## 2019-12-09 DIAGNOSIS — M7581 Other shoulder lesions, right shoulder: Secondary | ICD-10-CM | POA: Diagnosis not present

## 2019-12-09 DIAGNOSIS — M6281 Muscle weakness (generalized): Secondary | ICD-10-CM | POA: Diagnosis not present

## 2019-12-13 DIAGNOSIS — E7800 Pure hypercholesterolemia, unspecified: Secondary | ICD-10-CM | POA: Diagnosis not present

## 2019-12-13 DIAGNOSIS — Z125 Encounter for screening for malignant neoplasm of prostate: Secondary | ICD-10-CM | POA: Diagnosis not present

## 2019-12-13 DIAGNOSIS — M5431 Sciatica, right side: Secondary | ICD-10-CM | POA: Diagnosis not present

## 2019-12-13 DIAGNOSIS — Z0001 Encounter for general adult medical examination with abnormal findings: Secondary | ICD-10-CM | POA: Diagnosis not present

## 2019-12-13 DIAGNOSIS — R7309 Other abnormal glucose: Secondary | ICD-10-CM | POA: Diagnosis not present

## 2019-12-13 DIAGNOSIS — M545 Low back pain: Secondary | ICD-10-CM | POA: Diagnosis not present

## 2019-12-13 DIAGNOSIS — Z79899 Other long term (current) drug therapy: Secondary | ICD-10-CM | POA: Diagnosis not present

## 2019-12-14 DIAGNOSIS — M7581 Other shoulder lesions, right shoulder: Secondary | ICD-10-CM | POA: Diagnosis not present

## 2019-12-14 DIAGNOSIS — H401123 Primary open-angle glaucoma, left eye, severe stage: Secondary | ICD-10-CM | POA: Diagnosis not present

## 2019-12-14 DIAGNOSIS — M6281 Muscle weakness (generalized): Secondary | ICD-10-CM | POA: Diagnosis not present

## 2019-12-14 DIAGNOSIS — M545 Low back pain: Secondary | ICD-10-CM | POA: Diagnosis not present

## 2019-12-20 DIAGNOSIS — M545 Low back pain: Secondary | ICD-10-CM | POA: Diagnosis not present

## 2019-12-20 DIAGNOSIS — M7581 Other shoulder lesions, right shoulder: Secondary | ICD-10-CM | POA: Diagnosis not present

## 2019-12-20 DIAGNOSIS — M6281 Muscle weakness (generalized): Secondary | ICD-10-CM | POA: Diagnosis not present

## 2019-12-23 DIAGNOSIS — M545 Low back pain: Secondary | ICD-10-CM | POA: Diagnosis not present

## 2019-12-23 DIAGNOSIS — M6281 Muscle weakness (generalized): Secondary | ICD-10-CM | POA: Diagnosis not present

## 2019-12-23 DIAGNOSIS — M7581 Other shoulder lesions, right shoulder: Secondary | ICD-10-CM | POA: Diagnosis not present

## 2019-12-27 DIAGNOSIS — M7581 Other shoulder lesions, right shoulder: Secondary | ICD-10-CM | POA: Diagnosis not present

## 2019-12-27 DIAGNOSIS — M6281 Muscle weakness (generalized): Secondary | ICD-10-CM | POA: Diagnosis not present

## 2019-12-27 DIAGNOSIS — M545 Low back pain: Secondary | ICD-10-CM | POA: Diagnosis not present

## 2020-01-10 DIAGNOSIS — M7581 Other shoulder lesions, right shoulder: Secondary | ICD-10-CM | POA: Diagnosis not present

## 2020-01-10 DIAGNOSIS — M545 Low back pain: Secondary | ICD-10-CM | POA: Diagnosis not present

## 2020-01-10 DIAGNOSIS — M6281 Muscle weakness (generalized): Secondary | ICD-10-CM | POA: Diagnosis not present

## 2020-01-13 DIAGNOSIS — M545 Low back pain: Secondary | ICD-10-CM | POA: Diagnosis not present

## 2020-01-13 DIAGNOSIS — M7581 Other shoulder lesions, right shoulder: Secondary | ICD-10-CM | POA: Diagnosis not present

## 2020-01-13 DIAGNOSIS — M6281 Muscle weakness (generalized): Secondary | ICD-10-CM | POA: Diagnosis not present

## 2020-01-19 DIAGNOSIS — Z20822 Contact with and (suspected) exposure to covid-19: Secondary | ICD-10-CM | POA: Diagnosis not present

## 2020-01-24 ENCOUNTER — Other Ambulatory Visit: Payer: Self-pay | Admitting: Family Medicine

## 2020-01-24 DIAGNOSIS — M5431 Sciatica, right side: Secondary | ICD-10-CM

## 2020-02-11 ENCOUNTER — Ambulatory Visit
Admission: RE | Admit: 2020-02-11 | Discharge: 2020-02-11 | Disposition: A | Discharge status: Home / Self Care | Payer: PPO | Source: Ambulatory Visit | Attending: Family Medicine | Admitting: Family Medicine

## 2020-02-11 DIAGNOSIS — M48061 Spinal stenosis, lumbar region without neurogenic claudication: Secondary | ICD-10-CM | POA: Diagnosis not present

## 2020-02-11 DIAGNOSIS — M47816 Spondylosis without myelopathy or radiculopathy, lumbar region: Secondary | ICD-10-CM | POA: Diagnosis not present

## 2020-02-11 DIAGNOSIS — M5127 Other intervertebral disc displacement, lumbosacral region: Secondary | ICD-10-CM | POA: Diagnosis not present

## 2020-02-11 DIAGNOSIS — M5126 Other intervertebral disc displacement, lumbar region: Secondary | ICD-10-CM | POA: Diagnosis not present

## 2020-02-11 DIAGNOSIS — M5431 Sciatica, right side: Secondary | ICD-10-CM

## 2020-02-22 DIAGNOSIS — M545 Low back pain, unspecified: Secondary | ICD-10-CM | POA: Diagnosis not present

## 2020-02-22 DIAGNOSIS — M5116 Intervertebral disc disorders with radiculopathy, lumbar region: Secondary | ICD-10-CM | POA: Diagnosis not present

## 2020-02-22 DIAGNOSIS — M419 Scoliosis, unspecified: Secondary | ICD-10-CM | POA: Diagnosis not present

## 2020-03-01 DIAGNOSIS — M5416 Radiculopathy, lumbar region: Secondary | ICD-10-CM | POA: Diagnosis not present

## 2020-03-07 DIAGNOSIS — M5416 Radiculopathy, lumbar region: Secondary | ICD-10-CM | POA: Diagnosis not present

## 2020-03-09 DIAGNOSIS — M5416 Radiculopathy, lumbar region: Secondary | ICD-10-CM | POA: Diagnosis not present

## 2020-03-12 DIAGNOSIS — M5416 Radiculopathy, lumbar region: Secondary | ICD-10-CM | POA: Diagnosis not present

## 2020-03-14 DIAGNOSIS — M5416 Radiculopathy, lumbar region: Secondary | ICD-10-CM | POA: Diagnosis not present

## 2020-03-14 DIAGNOSIS — M419 Scoliosis, unspecified: Secondary | ICD-10-CM | POA: Diagnosis not present

## 2020-03-14 DIAGNOSIS — M545 Low back pain, unspecified: Secondary | ICD-10-CM | POA: Diagnosis not present

## 2020-03-16 DIAGNOSIS — M5416 Radiculopathy, lumbar region: Secondary | ICD-10-CM | POA: Diagnosis not present

## 2020-03-20 DIAGNOSIS — M5416 Radiculopathy, lumbar region: Secondary | ICD-10-CM | POA: Diagnosis not present

## 2020-03-22 DIAGNOSIS — M5416 Radiculopathy, lumbar region: Secondary | ICD-10-CM | POA: Diagnosis not present

## 2020-03-29 DIAGNOSIS — M5416 Radiculopathy, lumbar region: Secondary | ICD-10-CM | POA: Diagnosis not present

## 2020-04-02 DIAGNOSIS — H401123 Primary open-angle glaucoma, left eye, severe stage: Secondary | ICD-10-CM | POA: Diagnosis not present

## 2020-04-03 DIAGNOSIS — M5416 Radiculopathy, lumbar region: Secondary | ICD-10-CM | POA: Diagnosis not present

## 2020-04-05 DIAGNOSIS — Z23 Encounter for immunization: Secondary | ICD-10-CM | POA: Diagnosis not present

## 2020-04-10 DIAGNOSIS — R972 Elevated prostate specific antigen [PSA]: Secondary | ICD-10-CM | POA: Diagnosis not present

## 2020-04-10 DIAGNOSIS — R7309 Other abnormal glucose: Secondary | ICD-10-CM | POA: Diagnosis not present

## 2020-04-16 DIAGNOSIS — M5416 Radiculopathy, lumbar region: Secondary | ICD-10-CM | POA: Diagnosis not present

## 2020-04-25 DIAGNOSIS — R972 Elevated prostate specific antigen [PSA]: Secondary | ICD-10-CM | POA: Diagnosis not present

## 2020-04-27 DIAGNOSIS — R350 Frequency of micturition: Secondary | ICD-10-CM | POA: Diagnosis not present

## 2020-04-27 DIAGNOSIS — M5416 Radiculopathy, lumbar region: Secondary | ICD-10-CM | POA: Diagnosis not present

## 2020-04-27 DIAGNOSIS — R3912 Poor urinary stream: Secondary | ICD-10-CM | POA: Diagnosis not present

## 2020-04-27 DIAGNOSIS — R972 Elevated prostate specific antigen [PSA]: Secondary | ICD-10-CM | POA: Diagnosis not present

## 2020-05-02 DIAGNOSIS — M5416 Radiculopathy, lumbar region: Secondary | ICD-10-CM | POA: Diagnosis not present

## 2020-06-05 DIAGNOSIS — Z1159 Encounter for screening for other viral diseases: Secondary | ICD-10-CM | POA: Diagnosis not present

## 2020-06-08 DIAGNOSIS — K5730 Diverticulosis of large intestine without perforation or abscess without bleeding: Secondary | ICD-10-CM | POA: Diagnosis not present

## 2020-06-08 DIAGNOSIS — D120 Benign neoplasm of cecum: Secondary | ICD-10-CM | POA: Diagnosis not present

## 2020-06-08 DIAGNOSIS — K640 First degree hemorrhoids: Secondary | ICD-10-CM | POA: Diagnosis not present

## 2020-06-08 DIAGNOSIS — Z86010 Personal history of colonic polyps: Secondary | ICD-10-CM | POA: Diagnosis not present

## 2020-06-12 DIAGNOSIS — M5136 Other intervertebral disc degeneration, lumbar region: Secondary | ICD-10-CM | POA: Diagnosis not present

## 2020-06-12 DIAGNOSIS — M5416 Radiculopathy, lumbar region: Secondary | ICD-10-CM | POA: Diagnosis not present

## 2020-06-12 DIAGNOSIS — D120 Benign neoplasm of cecum: Secondary | ICD-10-CM | POA: Diagnosis not present

## 2020-06-12 DIAGNOSIS — M419 Scoliosis, unspecified: Secondary | ICD-10-CM | POA: Diagnosis not present

## 2020-06-15 DIAGNOSIS — Z79899 Other long term (current) drug therapy: Secondary | ICD-10-CM | POA: Diagnosis not present

## 2020-06-15 DIAGNOSIS — I700 Atherosclerosis of aorta: Secondary | ICD-10-CM | POA: Diagnosis not present

## 2020-06-15 DIAGNOSIS — R7309 Other abnormal glucose: Secondary | ICD-10-CM | POA: Diagnosis not present

## 2020-06-20 DIAGNOSIS — H25813 Combined forms of age-related cataract, bilateral: Secondary | ICD-10-CM | POA: Diagnosis not present

## 2020-06-20 DIAGNOSIS — H47391 Other disorders of optic disc, right eye: Secondary | ICD-10-CM | POA: Diagnosis not present

## 2020-06-20 DIAGNOSIS — H3561 Retinal hemorrhage, right eye: Secondary | ICD-10-CM | POA: Diagnosis not present

## 2020-06-20 DIAGNOSIS — H401123 Primary open-angle glaucoma, left eye, severe stage: Secondary | ICD-10-CM | POA: Diagnosis not present

## 2020-06-20 DIAGNOSIS — H43811 Vitreous degeneration, right eye: Secondary | ICD-10-CM | POA: Diagnosis not present

## 2020-06-20 DIAGNOSIS — H35032 Hypertensive retinopathy, left eye: Secondary | ICD-10-CM | POA: Diagnosis not present

## 2020-06-25 DIAGNOSIS — H4311 Vitreous hemorrhage, right eye: Secondary | ICD-10-CM | POA: Diagnosis not present

## 2020-06-25 DIAGNOSIS — H25813 Combined forms of age-related cataract, bilateral: Secondary | ICD-10-CM | POA: Diagnosis not present

## 2020-06-25 DIAGNOSIS — H3561 Retinal hemorrhage, right eye: Secondary | ICD-10-CM | POA: Diagnosis not present

## 2020-06-25 DIAGNOSIS — H401123 Primary open-angle glaucoma, left eye, severe stage: Secondary | ICD-10-CM | POA: Diagnosis not present

## 2020-06-25 DIAGNOSIS — H43811 Vitreous degeneration, right eye: Secondary | ICD-10-CM | POA: Diagnosis not present

## 2020-06-25 DIAGNOSIS — H47391 Other disorders of optic disc, right eye: Secondary | ICD-10-CM | POA: Diagnosis not present

## 2020-07-02 DIAGNOSIS — D23112 Other benign neoplasm of skin of right lower eyelid, including canthus: Secondary | ICD-10-CM | POA: Diagnosis not present

## 2020-07-02 DIAGNOSIS — H47391 Other disorders of optic disc, right eye: Secondary | ICD-10-CM | POA: Diagnosis not present

## 2020-07-02 DIAGNOSIS — H25813 Combined forms of age-related cataract, bilateral: Secondary | ICD-10-CM | POA: Diagnosis not present

## 2020-07-02 DIAGNOSIS — H02712 Chloasma of right lower eyelid and periocular area: Secondary | ICD-10-CM | POA: Diagnosis not present

## 2020-07-02 DIAGNOSIS — H3561 Retinal hemorrhage, right eye: Secondary | ICD-10-CM | POA: Diagnosis not present

## 2020-07-02 DIAGNOSIS — Z79899 Other long term (current) drug therapy: Secondary | ICD-10-CM | POA: Diagnosis not present

## 2020-07-02 DIAGNOSIS — H401123 Primary open-angle glaucoma, left eye, severe stage: Secondary | ICD-10-CM | POA: Diagnosis not present

## 2020-07-02 DIAGNOSIS — D23122 Other benign neoplasm of skin of left lower eyelid, including canthus: Secondary | ICD-10-CM | POA: Diagnosis not present

## 2020-07-02 DIAGNOSIS — H4311 Vitreous hemorrhage, right eye: Secondary | ICD-10-CM | POA: Diagnosis not present

## 2020-07-13 DIAGNOSIS — R7309 Other abnormal glucose: Secondary | ICD-10-CM | POA: Diagnosis not present

## 2020-07-20 DIAGNOSIS — H401123 Primary open-angle glaucoma, left eye, severe stage: Secondary | ICD-10-CM | POA: Diagnosis not present

## 2020-07-20 DIAGNOSIS — M5136 Other intervertebral disc degeneration, lumbar region: Secondary | ICD-10-CM | POA: Diagnosis not present

## 2020-07-20 DIAGNOSIS — M5416 Radiculopathy, lumbar region: Secondary | ICD-10-CM | POA: Diagnosis not present

## 2020-07-20 DIAGNOSIS — M419 Scoliosis, unspecified: Secondary | ICD-10-CM | POA: Diagnosis not present

## 2020-07-26 DIAGNOSIS — H43811 Vitreous degeneration, right eye: Secondary | ICD-10-CM | POA: Diagnosis not present

## 2020-07-26 DIAGNOSIS — H02712 Chloasma of right lower eyelid and periocular area: Secondary | ICD-10-CM | POA: Diagnosis not present

## 2020-07-26 DIAGNOSIS — H029 Unspecified disorder of eyelid: Secondary | ICD-10-CM | POA: Diagnosis not present

## 2020-07-26 DIAGNOSIS — H3561 Retinal hemorrhage, right eye: Secondary | ICD-10-CM | POA: Diagnosis not present

## 2020-07-26 DIAGNOSIS — D23112 Other benign neoplasm of skin of right lower eyelid, including canthus: Secondary | ICD-10-CM | POA: Diagnosis not present

## 2020-07-26 DIAGNOSIS — H25813 Combined forms of age-related cataract, bilateral: Secondary | ICD-10-CM | POA: Diagnosis not present

## 2020-07-26 DIAGNOSIS — H401123 Primary open-angle glaucoma, left eye, severe stage: Secondary | ICD-10-CM | POA: Diagnosis not present

## 2020-08-20 DIAGNOSIS — H401123 Primary open-angle glaucoma, left eye, severe stage: Secondary | ICD-10-CM | POA: Diagnosis not present

## 2020-09-06 DIAGNOSIS — H25813 Combined forms of age-related cataract, bilateral: Secondary | ICD-10-CM | POA: Diagnosis not present

## 2020-09-06 DIAGNOSIS — H3561 Retinal hemorrhage, right eye: Secondary | ICD-10-CM | POA: Diagnosis not present

## 2020-09-06 DIAGNOSIS — D2310 Other benign neoplasm of skin of unspecified eyelid, including canthus: Secondary | ICD-10-CM | POA: Diagnosis not present

## 2020-09-06 DIAGNOSIS — H43813 Vitreous degeneration, bilateral: Secondary | ICD-10-CM | POA: Diagnosis not present

## 2020-09-06 DIAGNOSIS — D23112 Other benign neoplasm of skin of right lower eyelid, including canthus: Secondary | ICD-10-CM | POA: Diagnosis not present

## 2020-09-06 DIAGNOSIS — D22112 Melanocytic nevi of right lower eyelid, including canthus: Secondary | ICD-10-CM | POA: Diagnosis not present

## 2020-09-06 DIAGNOSIS — D2210 Melanocytic nevi of unspecified eyelid, including canthus: Secondary | ICD-10-CM | POA: Diagnosis not present

## 2020-09-06 DIAGNOSIS — H401123 Primary open-angle glaucoma, left eye, severe stage: Secondary | ICD-10-CM | POA: Diagnosis not present

## 2020-12-24 DIAGNOSIS — R7309 Other abnormal glucose: Secondary | ICD-10-CM | POA: Diagnosis not present

## 2020-12-24 DIAGNOSIS — Z87898 Personal history of other specified conditions: Secondary | ICD-10-CM | POA: Diagnosis not present

## 2020-12-24 DIAGNOSIS — Z79899 Other long term (current) drug therapy: Secondary | ICD-10-CM | POA: Diagnosis not present

## 2020-12-24 DIAGNOSIS — E7800 Pure hypercholesterolemia, unspecified: Secondary | ICD-10-CM | POA: Diagnosis not present

## 2020-12-24 DIAGNOSIS — I700 Atherosclerosis of aorta: Secondary | ICD-10-CM | POA: Diagnosis not present

## 2020-12-24 DIAGNOSIS — R7303 Prediabetes: Secondary | ICD-10-CM | POA: Diagnosis not present

## 2020-12-24 DIAGNOSIS — Z0001 Encounter for general adult medical examination with abnormal findings: Secondary | ICD-10-CM | POA: Diagnosis not present

## 2020-12-28 ENCOUNTER — Other Ambulatory Visit (HOSPITAL_COMMUNITY): Payer: Self-pay | Admitting: Family Medicine

## 2021-01-09 ENCOUNTER — Ambulatory Visit (HOSPITAL_COMMUNITY)
Admission: RE | Admit: 2021-01-09 | Discharge: 2021-01-09 | Disposition: A | Discharge status: Home / Self Care | Payer: Self-pay | Source: Ambulatory Visit | Attending: Family Medicine | Admitting: Family Medicine

## 2021-01-09 ENCOUNTER — Other Ambulatory Visit: Payer: Self-pay

## 2021-01-09 DIAGNOSIS — E7800 Pure hypercholesterolemia, unspecified: Secondary | ICD-10-CM | POA: Insufficient documentation

## 2021-01-22 DIAGNOSIS — H401123 Primary open-angle glaucoma, left eye, severe stage: Secondary | ICD-10-CM | POA: Diagnosis not present

## 2021-01-22 DIAGNOSIS — H3561 Retinal hemorrhage, right eye: Secondary | ICD-10-CM | POA: Diagnosis not present

## 2021-01-22 DIAGNOSIS — H25813 Combined forms of age-related cataract, bilateral: Secondary | ICD-10-CM | POA: Diagnosis not present

## 2021-01-22 DIAGNOSIS — Z79899 Other long term (current) drug therapy: Secondary | ICD-10-CM | POA: Diagnosis not present

## 2021-01-28 DIAGNOSIS — H401123 Primary open-angle glaucoma, left eye, severe stage: Secondary | ICD-10-CM | POA: Diagnosis not present

## 2021-03-15 DIAGNOSIS — Z23 Encounter for immunization: Secondary | ICD-10-CM | POA: Diagnosis not present

## 2021-05-02 DIAGNOSIS — J45909 Unspecified asthma, uncomplicated: Secondary | ICD-10-CM | POA: Diagnosis not present

## 2021-05-02 DIAGNOSIS — U071 COVID-19: Secondary | ICD-10-CM | POA: Diagnosis not present

## 2021-05-27 DIAGNOSIS — H401123 Primary open-angle glaucoma, left eye, severe stage: Secondary | ICD-10-CM | POA: Diagnosis not present

## 2021-06-26 DIAGNOSIS — R7303 Prediabetes: Secondary | ICD-10-CM | POA: Diagnosis not present

## 2021-07-29 DIAGNOSIS — H3561 Retinal hemorrhage, right eye: Secondary | ICD-10-CM | POA: Diagnosis not present

## 2021-07-29 DIAGNOSIS — D23112 Other benign neoplasm of skin of right lower eyelid, including canthus: Secondary | ICD-10-CM | POA: Diagnosis not present

## 2021-07-29 DIAGNOSIS — H25813 Combined forms of age-related cataract, bilateral: Secondary | ICD-10-CM | POA: Diagnosis not present

## 2021-07-29 DIAGNOSIS — H401123 Primary open-angle glaucoma, left eye, severe stage: Secondary | ICD-10-CM | POA: Diagnosis not present

## 2021-08-02 DIAGNOSIS — L814 Other melanin hyperpigmentation: Secondary | ICD-10-CM | POA: Diagnosis not present

## 2021-08-02 DIAGNOSIS — H25813 Combined forms of age-related cataract, bilateral: Secondary | ICD-10-CM | POA: Diagnosis not present

## 2021-08-02 DIAGNOSIS — D23112 Other benign neoplasm of skin of right lower eyelid, including canthus: Secondary | ICD-10-CM | POA: Diagnosis not present

## 2021-08-02 DIAGNOSIS — H3561 Retinal hemorrhage, right eye: Secondary | ICD-10-CM | POA: Diagnosis not present

## 2021-09-11 DIAGNOSIS — H401123 Primary open-angle glaucoma, left eye, severe stage: Secondary | ICD-10-CM | POA: Diagnosis not present

## 2021-10-05 DIAGNOSIS — H43811 Vitreous degeneration, right eye: Secondary | ICD-10-CM | POA: Diagnosis not present

## 2021-10-05 DIAGNOSIS — H3581 Retinal edema: Secondary | ICD-10-CM | POA: Diagnosis not present

## 2021-10-05 DIAGNOSIS — H2513 Age-related nuclear cataract, bilateral: Secondary | ICD-10-CM | POA: Diagnosis not present

## 2021-10-05 DIAGNOSIS — H401123 Primary open-angle glaucoma, left eye, severe stage: Secondary | ICD-10-CM | POA: Diagnosis not present

## 2021-12-10 DIAGNOSIS — S41111A Laceration without foreign body of right upper arm, initial encounter: Secondary | ICD-10-CM | POA: Diagnosis not present

## 2021-12-10 DIAGNOSIS — H2513 Age-related nuclear cataract, bilateral: Secondary | ICD-10-CM | POA: Diagnosis not present

## 2021-12-10 DIAGNOSIS — Z6823 Body mass index (BMI) 23.0-23.9, adult: Secondary | ICD-10-CM | POA: Diagnosis not present

## 2021-12-10 DIAGNOSIS — H401123 Primary open-angle glaucoma, left eye, severe stage: Secondary | ICD-10-CM | POA: Diagnosis not present

## 2021-12-10 DIAGNOSIS — H43811 Vitreous degeneration, right eye: Secondary | ICD-10-CM | POA: Diagnosis not present

## 2021-12-12 DIAGNOSIS — H401123 Primary open-angle glaucoma, left eye, severe stage: Secondary | ICD-10-CM | POA: Diagnosis not present

## 2021-12-25 DIAGNOSIS — R7303 Prediabetes: Secondary | ICD-10-CM | POA: Diagnosis not present

## 2021-12-25 DIAGNOSIS — Z125 Encounter for screening for malignant neoplasm of prostate: Secondary | ICD-10-CM | POA: Diagnosis not present

## 2021-12-25 DIAGNOSIS — E7800 Pure hypercholesterolemia, unspecified: Secondary | ICD-10-CM | POA: Diagnosis not present

## 2021-12-25 DIAGNOSIS — I700 Atherosclerosis of aorta: Secondary | ICD-10-CM | POA: Diagnosis not present

## 2021-12-25 DIAGNOSIS — Z79899 Other long term (current) drug therapy: Secondary | ICD-10-CM | POA: Diagnosis not present

## 2021-12-25 DIAGNOSIS — Z0000 Encounter for general adult medical examination without abnormal findings: Secondary | ICD-10-CM | POA: Diagnosis not present

## 2022-01-13 DIAGNOSIS — R350 Frequency of micturition: Secondary | ICD-10-CM | POA: Diagnosis not present

## 2022-01-27 DIAGNOSIS — R350 Frequency of micturition: Secondary | ICD-10-CM | POA: Diagnosis not present

## 2022-03-17 DIAGNOSIS — H401123 Primary open-angle glaucoma, left eye, severe stage: Secondary | ICD-10-CM | POA: Diagnosis not present

## 2022-05-15 DIAGNOSIS — R197 Diarrhea, unspecified: Secondary | ICD-10-CM | POA: Diagnosis not present

## 2022-05-20 DIAGNOSIS — R197 Diarrhea, unspecified: Secondary | ICD-10-CM | POA: Diagnosis not present

## 2022-06-11 IMAGING — MR MR LUMBAR SPINE W/O CM
4 of 5 series · 26 of 48 positions shown · non-contrast
Comparison: 01/20/2015 MRI lumbar spine.

CLINICAL DATA: Back pain.

EXAM:
MRI LUMBAR SPINE WITHOUT CONTRAST
TECHNIQUE: Multiplanar, multisequence MR imaging of the lumbar spine was
performed. No intravenous contrast was administered.

[Series 2: T2 · sagittal · 4.0mm · 1.09mm/px · 5 of 16 slices shown (1 of 2)]
[im 1/16]
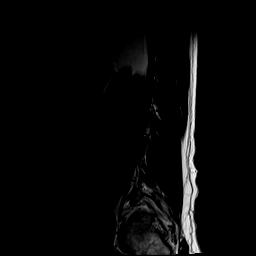
[im 4/16]
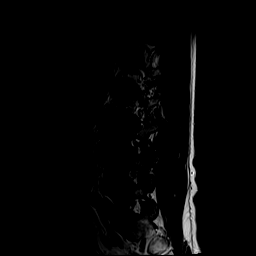
[im 8/16]
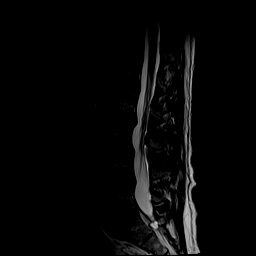
[im 12/16]
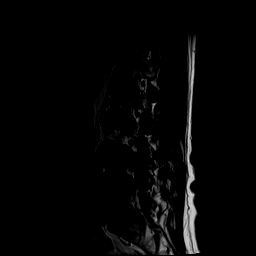
[im 16/16]
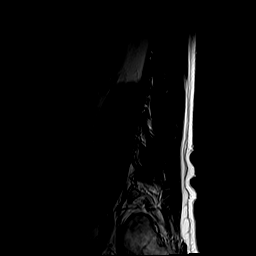

[Series 4: T1 · sagittal · 4.0mm · 1.09mm/px · 6 of 16 slices shown (1 of 2)]
[im 1/16]
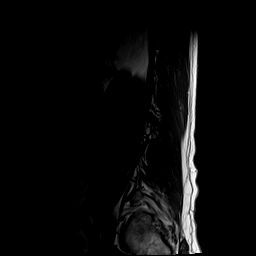
[im 4/16]
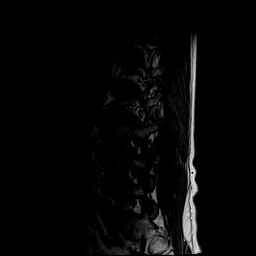
[im 7/16]
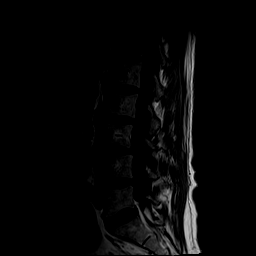
[im 10/16]
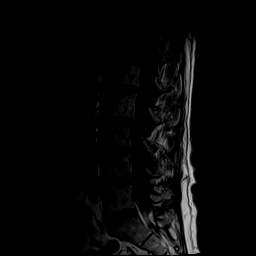
[im 13/16]
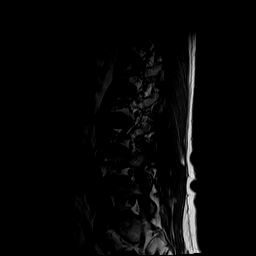
[im 16/16]
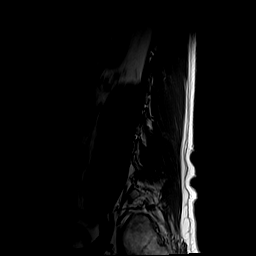

[Series 5: T2 · axial · 4.0mm · 0.39mm/px · z∈[-91,+110]mm · 10 of 44 slices shown (2 of 2)]
[im 3/44]
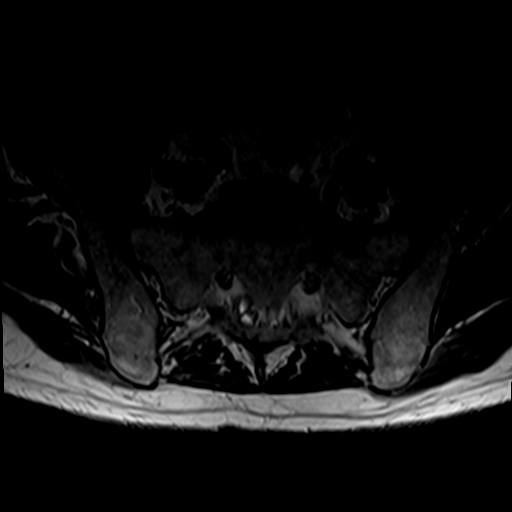
[im 6/44]
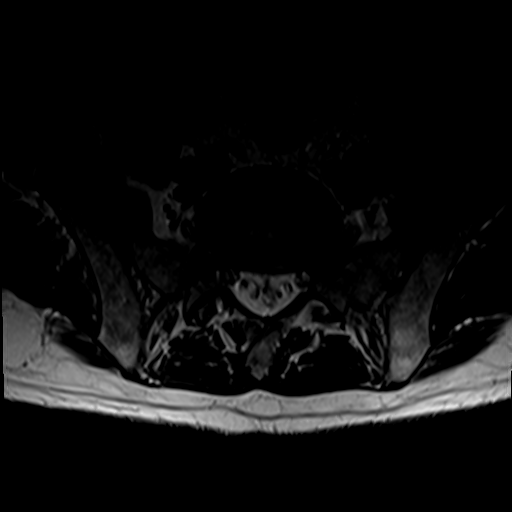
[im 9/44]
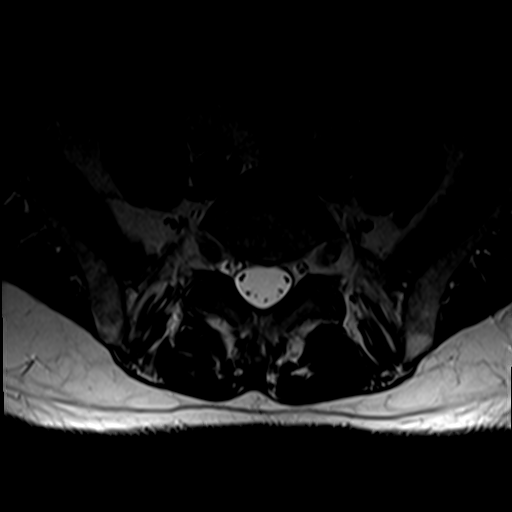
[im 15/44]
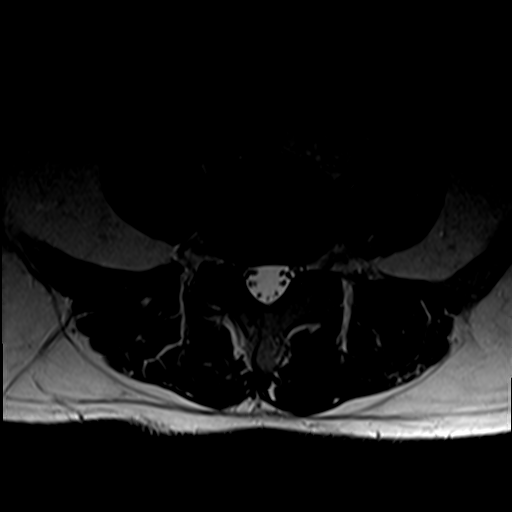
[im 21/44]
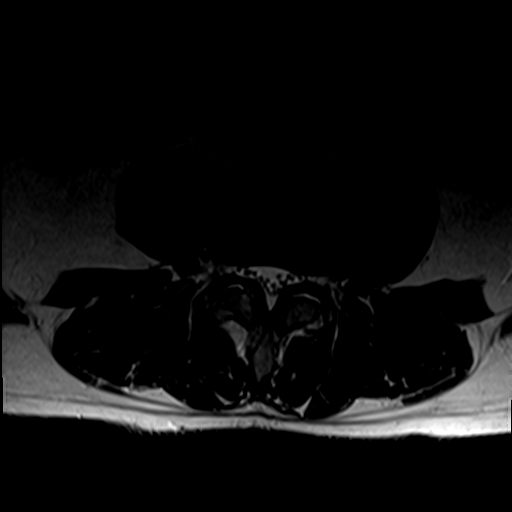
[im 23/44]
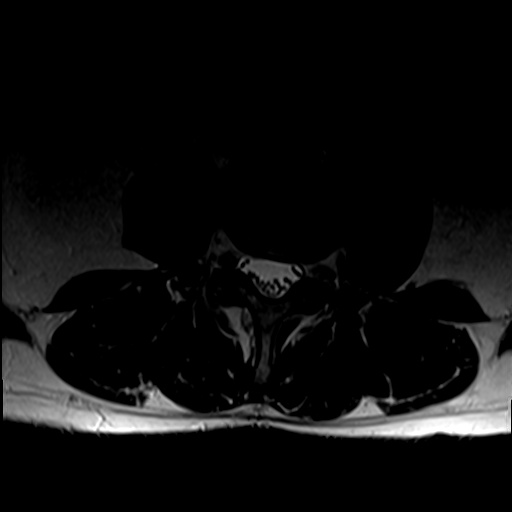
[im 26/44]
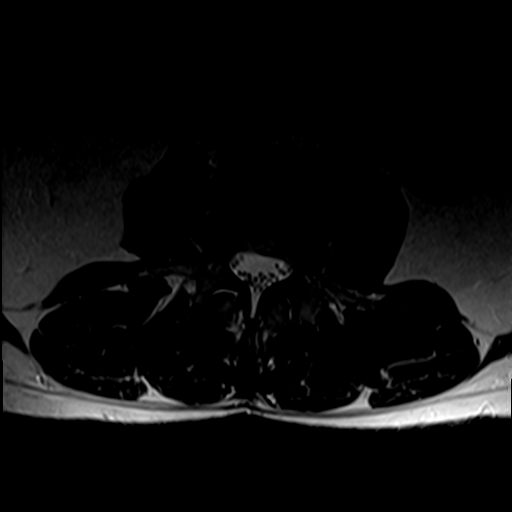
[im 32/44]
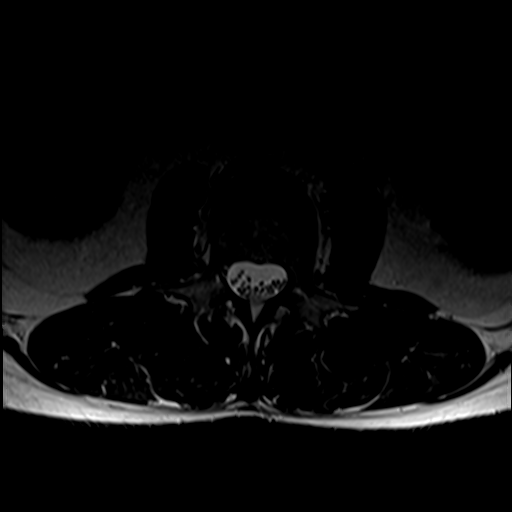
[im 38/44]
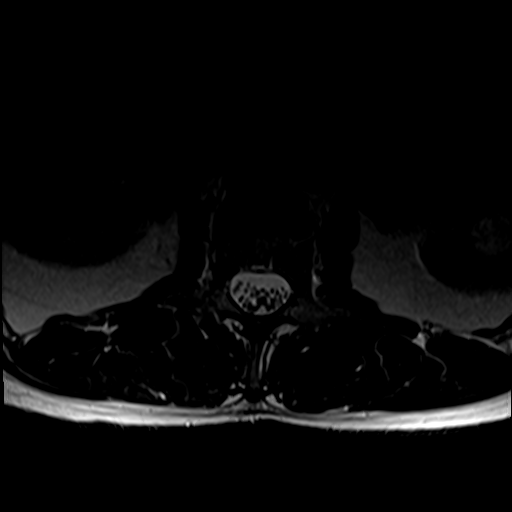
[im 44/44]
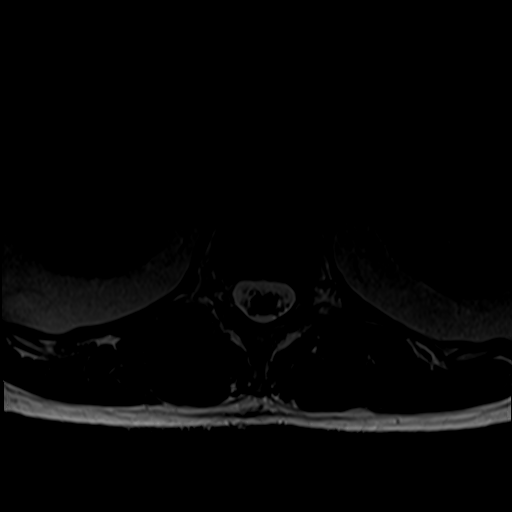

[Series 6: T1 · axial · 4.0mm · 0.39mm/px · z∈[-91,+81]mm · 5 of 44 slices shown (2 of 2)]
[im 3/44]
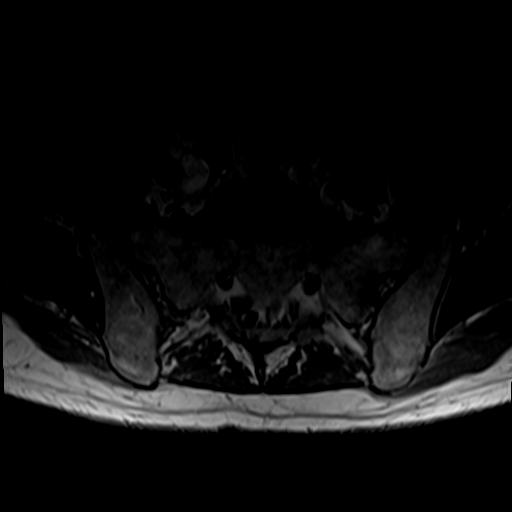
[im 6/44]
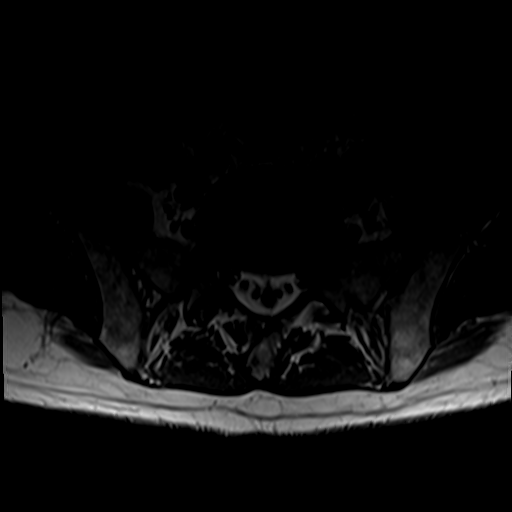
[im 9/44]
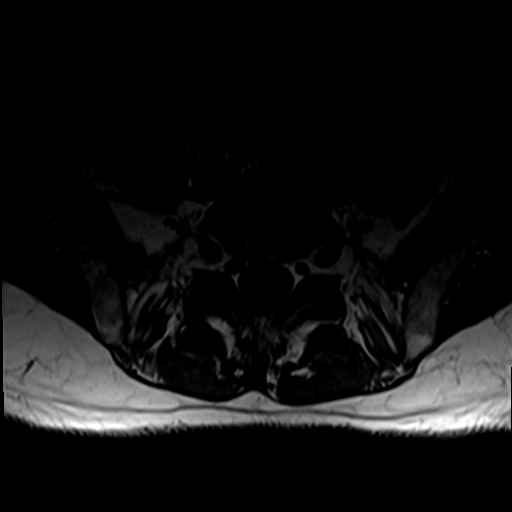
[im 23/44]
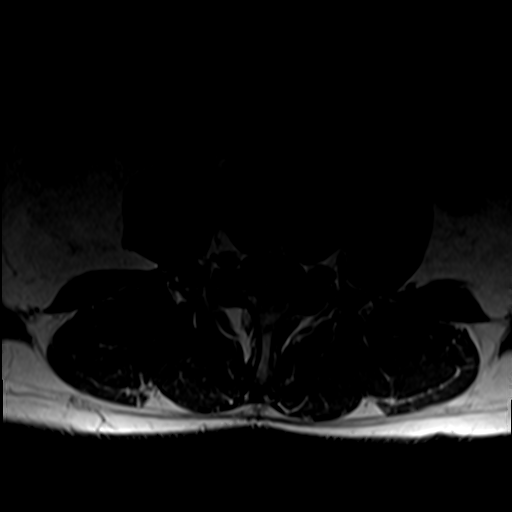
[im 38/44]
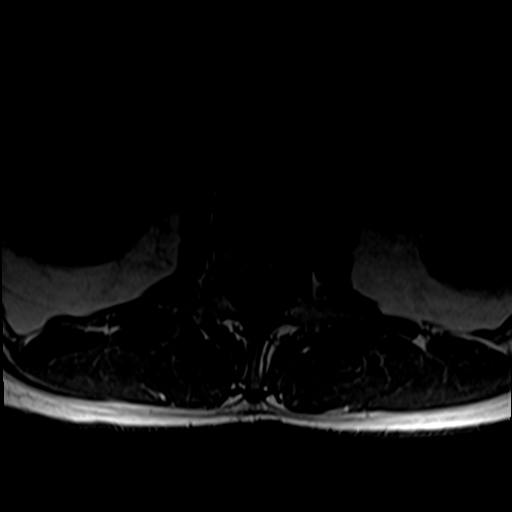

[26 of 48 positions shown; findings below may reference images not displayed]

FINDINGS: Segmentation:  Standard.

Alignment: Straightening of lordosis. Grade 1 L3-4 anterolisthesis,
unchanged.

Vertebrae: Normal bone marrow signal intensity. T1/T2 hyperintense
foci reflect hemangiomata versus focal fat.

Conus medullaris and cauda equina: Conus extends to the L1 level.
Conus and cauda equina appear normal.

Disc levels: Multilevel desiccation with mild disc space loss most
prominent at the L2-5 levels.

L1-2: No significant disc bulge, spinal canal or neural foraminal
narrowing.

L2-3: Mild disc bulge and bilateral facet hypertrophy. Patent spinal
canal. Mild bilateral neural foraminal narrowing.

L3-4: Disc bulge. Left foraminal/extraforaminal annular fissuring.
Mild ligamentum flavum and bilateral facet hypertrophy. Mild spinal
canal and bilateral neural foraminal narrowing.

L4-5: Disc bulge with superimposed right foraminal/extraforaminal
protrusion abutting the descending right L5 nerve root. There is
grazing of the exiting right L4 nerve root. Bilateral facet
hypertrophy. Patent spinal canal. Mild bilateral neural foraminal
narrowing.

L5-S1: Minimal disc bulge and bilateral facet hypertrophy. Patent
spinal canal and neural foramen.

Paraspinal and other soft tissues: Small sacral Tarlov cyst. Left
renal cyst.
IMPRESSION: Multilevel spondylosis, grossly unchanged.

Right L4-5 foraminal/extraforaminal protrusion abutting the exiting
L4 and descending L5 nerve roots.

Mild L3-4 spinal canal narrowing.

Mild bilateral L2-5 neural foraminal narrowing.

## 2022-06-27 DIAGNOSIS — R7303 Prediabetes: Secondary | ICD-10-CM | POA: Diagnosis not present

## 2022-07-10 DIAGNOSIS — H401123 Primary open-angle glaucoma, left eye, severe stage: Secondary | ICD-10-CM | POA: Diagnosis not present

## 2022-09-15 DIAGNOSIS — H52223 Regular astigmatism, bilateral: Secondary | ICD-10-CM | POA: Diagnosis not present

## 2022-09-15 DIAGNOSIS — H5203 Hypermetropia, bilateral: Secondary | ICD-10-CM | POA: Diagnosis not present

## 2022-09-15 DIAGNOSIS — H524 Presbyopia: Secondary | ICD-10-CM | POA: Diagnosis not present

## 2022-09-16 DIAGNOSIS — H3581 Retinal edema: Secondary | ICD-10-CM | POA: Diagnosis not present

## 2022-09-16 DIAGNOSIS — H401123 Primary open-angle glaucoma, left eye, severe stage: Secondary | ICD-10-CM | POA: Diagnosis not present

## 2022-09-16 DIAGNOSIS — H43811 Vitreous degeneration, right eye: Secondary | ICD-10-CM | POA: Diagnosis not present

## 2022-09-16 DIAGNOSIS — H2513 Age-related nuclear cataract, bilateral: Secondary | ICD-10-CM | POA: Diagnosis not present

## 2022-11-12 DIAGNOSIS — H401123 Primary open-angle glaucoma, left eye, severe stage: Secondary | ICD-10-CM | POA: Diagnosis not present

## 2023-01-06 DIAGNOSIS — R252 Cramp and spasm: Secondary | ICD-10-CM | POA: Diagnosis not present

## 2023-01-06 DIAGNOSIS — E7800 Pure hypercholesterolemia, unspecified: Secondary | ICD-10-CM | POA: Diagnosis not present

## 2023-01-06 DIAGNOSIS — I700 Atherosclerosis of aorta: Secondary | ICD-10-CM | POA: Diagnosis not present

## 2023-01-06 DIAGNOSIS — B351 Tinea unguium: Secondary | ICD-10-CM | POA: Diagnosis not present

## 2023-01-06 DIAGNOSIS — Z79899 Other long term (current) drug therapy: Secondary | ICD-10-CM | POA: Diagnosis not present

## 2023-01-06 DIAGNOSIS — Z0001 Encounter for general adult medical examination with abnormal findings: Secondary | ICD-10-CM | POA: Diagnosis not present

## 2023-01-06 DIAGNOSIS — E119 Type 2 diabetes mellitus without complications: Secondary | ICD-10-CM | POA: Diagnosis not present

## 2023-01-06 DIAGNOSIS — Z125 Encounter for screening for malignant neoplasm of prostate: Secondary | ICD-10-CM | POA: Diagnosis not present

## 2023-04-15 DIAGNOSIS — H401123 Primary open-angle glaucoma, left eye, severe stage: Secondary | ICD-10-CM | POA: Diagnosis not present

## 2023-07-16 DIAGNOSIS — Z79899 Other long term (current) drug therapy: Secondary | ICD-10-CM | POA: Diagnosis not present

## 2023-07-16 DIAGNOSIS — E119 Type 2 diabetes mellitus without complications: Secondary | ICD-10-CM | POA: Diagnosis not present

## 2023-07-16 DIAGNOSIS — E7800 Pure hypercholesterolemia, unspecified: Secondary | ICD-10-CM | POA: Diagnosis not present

## 2023-08-18 DIAGNOSIS — E7800 Pure hypercholesterolemia, unspecified: Secondary | ICD-10-CM | POA: Diagnosis not present

## 2023-08-18 DIAGNOSIS — E119 Type 2 diabetes mellitus without complications: Secondary | ICD-10-CM | POA: Diagnosis not present

## 2023-08-31 DIAGNOSIS — H401123 Primary open-angle glaucoma, left eye, severe stage: Secondary | ICD-10-CM | POA: Diagnosis not present

## 2023-10-16 DIAGNOSIS — Z79899 Other long term (current) drug therapy: Secondary | ICD-10-CM | POA: Diagnosis not present

## 2023-10-16 DIAGNOSIS — E119 Type 2 diabetes mellitus without complications: Secondary | ICD-10-CM | POA: Diagnosis not present

## 2023-10-20 DIAGNOSIS — H401123 Primary open-angle glaucoma, left eye, severe stage: Secondary | ICD-10-CM | POA: Diagnosis not present

## 2023-10-20 DIAGNOSIS — H43811 Vitreous degeneration, right eye: Secondary | ICD-10-CM | POA: Diagnosis not present

## 2023-10-20 DIAGNOSIS — H2513 Age-related nuclear cataract, bilateral: Secondary | ICD-10-CM | POA: Diagnosis not present

## 2023-11-13 DIAGNOSIS — E119 Type 2 diabetes mellitus without complications: Secondary | ICD-10-CM | POA: Diagnosis not present

## 2023-11-16 DIAGNOSIS — E7800 Pure hypercholesterolemia, unspecified: Secondary | ICD-10-CM | POA: Diagnosis not present

## 2023-11-16 DIAGNOSIS — E119 Type 2 diabetes mellitus without complications: Secondary | ICD-10-CM | POA: Diagnosis not present

## 2023-12-12 DIAGNOSIS — E119 Type 2 diabetes mellitus without complications: Secondary | ICD-10-CM | POA: Diagnosis not present

## 2023-12-17 DIAGNOSIS — E7800 Pure hypercholesterolemia, unspecified: Secondary | ICD-10-CM | POA: Diagnosis not present

## 2023-12-17 DIAGNOSIS — E119 Type 2 diabetes mellitus without complications: Secondary | ICD-10-CM | POA: Diagnosis not present

## 2024-01-11 DIAGNOSIS — E119 Type 2 diabetes mellitus without complications: Secondary | ICD-10-CM | POA: Diagnosis not present

## 2024-01-17 DIAGNOSIS — E119 Type 2 diabetes mellitus without complications: Secondary | ICD-10-CM | POA: Diagnosis not present

## 2024-01-17 DIAGNOSIS — E7800 Pure hypercholesterolemia, unspecified: Secondary | ICD-10-CM | POA: Diagnosis not present

## 2024-01-22 DIAGNOSIS — M5459 Other low back pain: Secondary | ICD-10-CM | POA: Diagnosis not present

## 2024-01-22 DIAGNOSIS — E7800 Pure hypercholesterolemia, unspecified: Secondary | ICD-10-CM | POA: Diagnosis not present

## 2024-01-22 DIAGNOSIS — Z79899 Other long term (current) drug therapy: Secondary | ICD-10-CM | POA: Diagnosis not present

## 2024-01-22 DIAGNOSIS — Z125 Encounter for screening for malignant neoplasm of prostate: Secondary | ICD-10-CM | POA: Diagnosis not present

## 2024-01-22 DIAGNOSIS — E119 Type 2 diabetes mellitus without complications: Secondary | ICD-10-CM | POA: Diagnosis not present

## 2024-01-22 DIAGNOSIS — Z23 Encounter for immunization: Secondary | ICD-10-CM | POA: Diagnosis not present

## 2024-01-22 DIAGNOSIS — Z0000 Encounter for general adult medical examination without abnormal findings: Secondary | ICD-10-CM | POA: Diagnosis not present

## 2024-02-10 DIAGNOSIS — E119 Type 2 diabetes mellitus without complications: Secondary | ICD-10-CM | POA: Diagnosis not present

## 2024-02-16 DIAGNOSIS — E7800 Pure hypercholesterolemia, unspecified: Secondary | ICD-10-CM | POA: Diagnosis not present

## 2024-02-16 DIAGNOSIS — E119 Type 2 diabetes mellitus without complications: Secondary | ICD-10-CM | POA: Diagnosis not present

## 2024-03-11 DIAGNOSIS — E119 Type 2 diabetes mellitus without complications: Secondary | ICD-10-CM | POA: Diagnosis not present

## 2024-03-18 DIAGNOSIS — E7800 Pure hypercholesterolemia, unspecified: Secondary | ICD-10-CM | POA: Diagnosis not present

## 2024-03-18 DIAGNOSIS — E119 Type 2 diabetes mellitus without complications: Secondary | ICD-10-CM | POA: Diagnosis not present

## 2024-03-28 DIAGNOSIS — H401123 Primary open-angle glaucoma, left eye, severe stage: Secondary | ICD-10-CM | POA: Diagnosis not present

## 2024-04-10 DIAGNOSIS — E119 Type 2 diabetes mellitus without complications: Secondary | ICD-10-CM | POA: Diagnosis not present

## 2024-04-17 DIAGNOSIS — E7800 Pure hypercholesterolemia, unspecified: Secondary | ICD-10-CM | POA: Diagnosis not present

## 2024-04-17 DIAGNOSIS — E119 Type 2 diabetes mellitus without complications: Secondary | ICD-10-CM | POA: Diagnosis not present

## 2024-04-26 DIAGNOSIS — H029 Unspecified disorder of eyelid: Secondary | ICD-10-CM | POA: Diagnosis not present

## 2024-04-26 DIAGNOSIS — D2210 Melanocytic nevi of unspecified eyelid, including canthus: Secondary | ICD-10-CM | POA: Diagnosis not present

## 2024-04-26 DIAGNOSIS — H401123 Primary open-angle glaucoma, left eye, severe stage: Secondary | ICD-10-CM | POA: Diagnosis not present
